# Patient Record
Sex: Female | Born: 1970 | Race: White | Hispanic: No | Smoking: Never smoker
Health system: Southern US, Community
[De-identification: ages and names within clinical notes are randomized; demographics above are authoritative.]

## PROBLEM LIST (undated history)

## (undated) DIAGNOSIS — E079 Disorder of thyroid, unspecified: Secondary | ICD-10-CM

## (undated) DIAGNOSIS — R519 Headache, unspecified: Secondary | ICD-10-CM

## (undated) DIAGNOSIS — G4730 Sleep apnea, unspecified: Secondary | ICD-10-CM

## (undated) DIAGNOSIS — C801 Malignant (primary) neoplasm, unspecified: Secondary | ICD-10-CM

## (undated) DIAGNOSIS — R51 Headache: Secondary | ICD-10-CM

## (undated) HISTORY — PX: ABDOMINAL HYSTERECTOMY: SHX81

## (undated) HISTORY — PX: TOTAL ABDOMINAL HYSTERECTOMY: SHX209

## (undated) HISTORY — PX: BREAST SURGERY: SHX581

---

## 1994-06-21 HISTORY — PX: BREAST REDUCTION SURGERY: SHX8

## 2007-05-13 ENCOUNTER — Emergency Department: Payer: Self-pay | Admitting: Unknown Physician Specialty

## 2010-02-16 ENCOUNTER — Emergency Department: Payer: Self-pay | Admitting: Emergency Medicine

## 2010-03-06 ENCOUNTER — Ambulatory Visit: Payer: Self-pay | Admitting: Internal Medicine

## 2010-03-16 ENCOUNTER — Ambulatory Visit: Payer: Self-pay | Admitting: Internal Medicine

## 2011-03-10 ENCOUNTER — Ambulatory Visit: Payer: Self-pay | Admitting: Internal Medicine

## 2011-04-07 ENCOUNTER — Ambulatory Visit: Payer: Self-pay | Admitting: Internal Medicine

## 2014-06-21 HISTORY — PX: ROBOTIC ASSISTED TOTAL HYSTERECTOMY: SHX6085

## 2014-12-27 DIAGNOSIS — Z8542 Personal history of malignant neoplasm of other parts of uterus: Secondary | ICD-10-CM | POA: Insufficient documentation

## 2014-12-27 DIAGNOSIS — C541 Malignant neoplasm of endometrium: Secondary | ICD-10-CM | POA: Insufficient documentation

## 2015-06-29 ENCOUNTER — Encounter: Payer: Self-pay | Admitting: Internal Medicine

## 2015-06-29 ENCOUNTER — Other Ambulatory Visit: Payer: Self-pay | Admitting: Internal Medicine

## 2015-06-29 DIAGNOSIS — F32A Depression, unspecified: Secondary | ICD-10-CM

## 2015-06-29 DIAGNOSIS — G43909 Migraine, unspecified, not intractable, without status migrainosus: Secondary | ICD-10-CM | POA: Insufficient documentation

## 2015-06-29 DIAGNOSIS — F329 Major depressive disorder, single episode, unspecified: Secondary | ICD-10-CM

## 2015-06-29 DIAGNOSIS — G5600 Carpal tunnel syndrome, unspecified upper limb: Secondary | ICD-10-CM | POA: Insufficient documentation

## 2015-06-29 DIAGNOSIS — E039 Hypothyroidism, unspecified: Secondary | ICD-10-CM

## 2015-06-29 DIAGNOSIS — G4733 Obstructive sleep apnea (adult) (pediatric): Secondary | ICD-10-CM | POA: Insufficient documentation

## 2015-06-29 DIAGNOSIS — E890 Postprocedural hypothyroidism: Secondary | ICD-10-CM | POA: Insufficient documentation

## 2015-06-29 DIAGNOSIS — C541 Malignant neoplasm of endometrium: Secondary | ICD-10-CM

## 2015-06-29 DIAGNOSIS — F324 Major depressive disorder, single episode, in partial remission: Secondary | ICD-10-CM | POA: Insufficient documentation

## 2015-06-29 DIAGNOSIS — I7300 Raynaud's syndrome without gangrene: Secondary | ICD-10-CM | POA: Insufficient documentation

## 2015-06-29 DIAGNOSIS — K219 Gastro-esophageal reflux disease without esophagitis: Secondary | ICD-10-CM

## 2015-06-29 DIAGNOSIS — E785 Hyperlipidemia, unspecified: Secondary | ICD-10-CM | POA: Insufficient documentation

## 2015-07-07 ENCOUNTER — Encounter: Payer: Self-pay | Admitting: Internal Medicine

## 2015-07-07 ENCOUNTER — Ambulatory Visit (INDEPENDENT_AMBULATORY_CARE_PROVIDER_SITE_OTHER): Payer: BC Managed Care – PPO | Admitting: Internal Medicine

## 2015-07-07 ENCOUNTER — Other Ambulatory Visit: Payer: Self-pay | Admitting: Internal Medicine

## 2015-07-07 VITALS — BP 116/70 | HR 76 | Ht 62.0 in | Wt 242.6 lb

## 2015-07-07 DIAGNOSIS — R1011 Right upper quadrant pain: Secondary | ICD-10-CM | POA: Diagnosis not present

## 2015-07-07 DIAGNOSIS — C541 Malignant neoplasm of endometrium: Secondary | ICD-10-CM | POA: Diagnosis not present

## 2015-07-07 DIAGNOSIS — R7303 Prediabetes: Secondary | ICD-10-CM | POA: Diagnosis not present

## 2015-07-07 NOTE — Progress Notes (Signed)
Date:  07/07/2015   Name:  Marilyn Robertson   DOB:  [birth year]   MRN:  [id number]   Chief Complaint: Abdominal Pain and Nausea Abdominal Pain This is a new problem. The current episode started more than 1 month ago. The onset quality is undetermined. The problem occurs every several days. The problem has been gradually worsening. The pain is located in the RUQ. Associated symptoms include constipation, diarrhea, nausea and vomiting. Pertinent negatives include no dysuria, fever or headaches.   for the past 6 months she's had intermittent episodes of nausea with occasional vomiting about 1-2 times every few weeks. This is also associated with spells of loose watery stools that lasts throughout the day. She denies any blood in the stool fever, chills, or weight loss. She is concerned about gallbladder disease. Of note, 6 months ago was told she was prediabetic and started on metformin 1000 mg twice a day.   Review of Systems  Constitutional: Negative for fever, chills, fatigue and unexpected weight change.  Respiratory: Negative for chest tightness and shortness of breath.   Cardiovascular: Negative for chest pain.  Gastrointestinal: Positive for nausea, vomiting, abdominal pain, diarrhea and constipation. Negative for blood in stool.  Genitourinary: Negative for dysuria.  Neurological: Negative for light-headedness and headaches.    Patient Active Problem List   Diagnosis Date Noted  . Clinical depression 06/29/2015  . Headache, migraine 06/29/2015  . Acid reflux 06/29/2015  . Carpal tunnel syndrome 06/29/2015  . Hypothyroidism 06/29/2015  . Paroxysmal digital cyanosis 06/29/2015  . OSA (obstructive sleep apnea) 06/29/2015  . Hyperlipidemia, mild 06/29/2015  . Malignant neoplasm of endometrium ([place]) 12/27/2014    Prior to Admission medications   Medication Sig Start Date End Date Taking? Authorizing Provider  Biotin 2500 MCG CAPS Take by mouth.   Yes Historical Provider, MD    butalbital-acetaminophen-caffeine (FIORICET) 50-325-40 MG tablet Take by mouth.   Yes Historical Provider, MD  Calcium Carbonate-Vit D-Min (CALCIUM 1200 PO) Take by mouth.   Yes Historical Provider, MD  Cetirizine HCl (ZYRTEC ALLERGY) 10 MG CAPS Take by mouth.   Yes Historical Provider, MD  Cholecalciferol (VITAMIN D) 2000 units CAPS Take by mouth.   Yes Historical Provider, MD  co-enzyme Q-10 30 MG capsule Take 30 mg by mouth 3 (three) times daily.   Yes Historical Provider, MD  cyanocobalamin 100 MCG tablet Take 100 mcg by mouth daily.   Yes Historical Provider, MD  diclofenac (CATAFLAM) 50 MG tablet Take by mouth.   Yes Historical Provider, MD  DULoxetine (CYMBALTA) 30 MG capsule Reported on 07/07/2015 11/12/14  Yes Historical Provider, MD  Inulin (FIBER CHOICE PO) Take by mouth.   Yes Historical Provider, MD  metformin (FORTAMET) 1000 MG (OSM) 24 hr tablet Take 1,000 mg by mouth. 11/19/14  Yes Historical Provider, MD  propranolol ER (INDERAL LA) 60 MG 24 hr capsule Take 1 capsule by mouth daily. 05/19/15  Yes Historical Provider, MD  SUMAtriptan (IMITREX) 100 MG tablet Take by mouth.   Yes Historical Provider, MD  thyroid (NATURE-THROID) 130 MG tablet  10/24/14  Yes Historical Provider, MD  traMADol (ULTRAM) 50 MG tablet Take by mouth.   Yes Historical Provider, MD    Allergies  Allergen Reactions  . Bee Venom Shortness Of Breath and Swelling  . Topiramate Swelling    Past Surgical History  Procedure Laterality Date  . Breast reduction surgery  1996  . Robotic assisted total hysterectomy  2016    atypical endometrial hyperplasia  Social History  Substance Use Topics  . Smoking status: Never Smoker   . Smokeless tobacco: None  . Alcohol Use: 1.2 oz/week    2 Standard drinks or equivalent per week     Comment: occasional     Medication list has been reviewed and updated.   Physical Exam  Constitutional: She is oriented to person, place, and time. She appears  well-developed. No distress.  HENT:  Head: Normocephalic and atraumatic.  Cardiovascular: Normal rate, regular rhythm and normal heart sounds.   Pulmonary/Chest: Effort normal and breath sounds normal. No respiratory distress.  Abdominal: Soft. Normal appearance and bowel sounds are normal. There is no hepatosplenomegaly. There is tenderness in the right upper quadrant. There is no rigidity, no rebound, no guarding and no CVA tenderness.  Musculoskeletal: Normal range of motion.  Neurological: She is alert and oriented to person, place, and time.  Skin: Skin is warm and dry. No rash noted.  Psychiatric: She has a normal mood and affect. Her behavior is normal. Thought content normal.  Nursing note and vitals reviewed.   BP 116/70 mmHg  Pulse 76  Ht 5\' 2"  (1.575 m)  Wt 242 lb 9.6 oz (110.043 kg)  BMI 44.36 kg/m2  Assessment and Plan: 1. Right upper quadrant pain Avoid fatty foods Consider trial of proton pump inhibitor if ultrasound is negative - US Abdomen Limited RUQ; Future  2. Pre-diabetes Metformin may be contributing to a number of her symptoms so we will hold that until ultrasound is complete   Halina Maidens, MD [facility]  07/07/2015

## 2015-07-09 ENCOUNTER — Ambulatory Visit
Admission: RE | Admit: 2015-07-09 | Discharge: 2015-07-09 | Disposition: A | Discharge status: Home / Self Care | Payer: BC Managed Care – PPO | Source: Ambulatory Visit | Attending: Internal Medicine | Admitting: Internal Medicine

## 2015-07-09 DIAGNOSIS — K760 Fatty (change of) liver, not elsewhere classified: Secondary | ICD-10-CM | POA: Diagnosis not present

## 2015-07-09 DIAGNOSIS — R1011 Right upper quadrant pain: Secondary | ICD-10-CM | POA: Diagnosis not present

## 2015-07-10 ENCOUNTER — Telehealth: Payer: Self-pay

## 2015-07-10 NOTE — Telephone Encounter (Signed)
Spoke with patient. Patient advised of all results and verbalized understanding. Will call back with any future questions or concerns. MAH  

## 2015-07-10 NOTE — Telephone Encounter (Signed)
-----   Message from Glean Hess, MD sent at 07/09/2015 12:50 PM EST ----- [place] negative for gall stones or any abnormality.  I recommend taking omeprazole 20 mg daily (otc) for 2 weeks.  If still having problems, return for additional tests.

## 2016-10-22 ENCOUNTER — Ambulatory Visit (INDEPENDENT_AMBULATORY_CARE_PROVIDER_SITE_OTHER): Payer: BC Managed Care – PPO | Admitting: Internal Medicine

## 2016-10-22 ENCOUNTER — Encounter: Payer: Self-pay | Admitting: Internal Medicine

## 2016-10-22 VITALS — BP 112/72 | HR 89 | Ht 62.0 in | Wt 258.0 lb

## 2016-10-22 DIAGNOSIS — R42 Dizziness and giddiness: Secondary | ICD-10-CM

## 2016-10-22 DIAGNOSIS — H6501 Acute serous otitis media, right ear: Secondary | ICD-10-CM | POA: Diagnosis not present

## 2016-10-22 MED ORDER — PREDNISONE 10 MG PO TABS: ORAL_TABLET | ORAL | 0 refills | Status: DC

## 2016-10-22 MED ORDER — MECLIZINE HCL 25 MG PO TABS: 25.0000 mg | ORAL_TABLET | Freq: Three times a day (TID) | ORAL | 0 refills | Status: DC | PRN

## 2016-10-22 NOTE — Progress Notes (Signed)
Date:  10/22/2016   Name:  Marilyn Robertson   DOB:  Jul 12, 1970   MRN:  [id number]   Chief Complaint: Dizziness (Worse when lying down. Started thursday and been dizzy since. Has dizzinees feeling while turning head. ) Dizziness  This is a new problem. The current episode started yesterday. The problem has been unchanged. Associated symptoms include nausea. Pertinent negatives include no chest pain, chills, fatigue, fever, sore throat or vomiting. The symptoms are aggravated by twisting and bending. She has tried nothing for the symptoms. The treatment provided no relief.      Review of Systems  Constitutional: Negative for chills, fatigue and fever.  HENT: Negative for hearing loss and sore throat. Ear pain: fullness.   Eyes: Negative for visual disturbance.  Respiratory: Negative for chest tightness and wheezing.   Cardiovascular: Negative for chest pain and palpitations.  Gastrointestinal: Positive for nausea. Negative for vomiting.  Neurological: Positive for dizziness.    Patient Active Problem List   Diagnosis Date Noted  . Pre-diabetes 07/07/2015  . Clinical depression 06/29/2015  . Headache, migraine 06/29/2015  . Acid reflux 06/29/2015  . Carpal tunnel syndrome 06/29/2015  . Hypothyroidism 06/29/2015  . Paroxysmal digital cyanosis 06/29/2015  . OSA (obstructive sleep apnea) 06/29/2015  . Hyperlipidemia, mild 06/29/2015  . Malignant neoplasm of endometrium ([place]) 12/27/2014    Prior to Admission medications   Medication Sig Start Date End Date Taking? Authorizing Provider  Biotin 2500 MCG CAPS Take by mouth.    Historical Provider, MD  butalbital-acetaminophen-caffeine (FIORICET) 50-325-40 MG tablet Take by mouth.    Historical Provider, MD  Calcium Carbonate-Vit D-Min (CALCIUM 1200 PO) Take by mouth.    Historical Provider, MD  Cetirizine HCl (ZYRTEC ALLERGY) 10 MG CAPS Take by mouth.    Historical Provider, MD  Cholecalciferol (VITAMIN D) 2000 units CAPS Take by  mouth.    Historical Provider, MD  co-enzyme Q-10 30 MG capsule Take 30 mg by mouth 3 (three) times daily.    Historical Provider, MD  cyanocobalamin 100 MCG tablet Take 100 mcg by mouth daily.    Historical Provider, MD  diclofenac (CATAFLAM) 50 MG tablet Take by mouth.    Historical Provider, MD  DULoxetine (CYMBALTA) 30 MG capsule Reported on 07/07/2015 11/12/14   Historical Provider, MD  Inulin (FIBER CHOICE PO) Take by mouth.    Historical Provider, MD  metformin (FORTAMET) 1000 MG (OSM) 24 hr tablet Take 1,000 mg by mouth. 11/19/14   Historical Provider, MD  propranolol ER (INDERAL LA) 60 MG 24 hr capsule Take 1 capsule by mouth daily. 05/19/15   Historical Provider, MD  SUMAtriptan (IMITREX) 100 MG tablet Take by mouth.    Historical Provider, MD  thyroid (NATURE-THROID) 130 MG tablet  10/24/14   Historical Provider, MD  traMADol (ULTRAM) 50 MG tablet Take by mouth.    Historical Provider, MD    Allergies  Allergen Reactions  . Bee Venom Shortness Of Breath and Swelling  . Topiramate Swelling, Anxiety and Rash    "severe watery eyes"; "swelling and reddening of face"     Past Surgical History:  Procedure Laterality Date  . BREAST REDUCTION SURGERY  1996  . ROBOTIC ASSISTED TOTAL HYSTERECTOMY  2016   atypical endometrial hyperplasia    Social History  Substance Use Topics  . Smoking status: Never Smoker  . Smokeless tobacco: Never Used  . Alcohol use 1.2 oz/week    2 Standard drinks or equivalent per week  Comment: occasional     Medication list has been reviewed and updated.   Physical Exam  Constitutional: She is oriented to person, place, and time. She appears well-developed. No distress.  HENT:  Head: Normocephalic and atraumatic.  Right Ear: Ear canal normal. A middle ear effusion is present.  Left Ear: Tympanic membrane and ear canal normal.  Mouth/Throat: No posterior oropharyngeal edema or posterior oropharyngeal erythema.  Eyes: Conjunctivae are normal.  Pupils are equal, round, and reactive to light. Right eye exhibits nystagmus. Left eye exhibits nystagmus.  Cardiovascular: Normal rate, regular rhythm and normal heart sounds.   Pulmonary/Chest: Effort normal and breath sounds normal. No respiratory distress.  Musculoskeletal: Normal range of motion.  Neurological: She is alert and oriented to person, place, and time.  Skin: Skin is warm and dry. No rash noted.  Psychiatric: She has a normal mood and affect. Her behavior is normal. Thought content normal.  Nursing note and vitals reviewed.   BP 112/72 (BP Location: Right Arm, Patient Position: Sitting, Cuff Size: Large)   Pulse 89   Ht 5\' 2"  (1.575 m)   Wt 258 lb (117 kg)   SpO2 96%   BMI 47.19 kg/m   Assessment and Plan: 1. Vertigo Continue flonase  - predniSONE (DELTASONE) 10 MG tablet; Take 6 on day 1, 5 on day 2, 4 on day 3, 3 on day 4, 2 on day 5 and 1 on day 1 then stop.  Dispense: 21 tablet; Refill: 0 - meclizine (ANTIVERT) 25 MG tablet; Take 1 tablet (25 mg total) by mouth 3 (three) times daily as needed for dizziness.  Dispense: 30 tablet; Refill: 0  2. Right acute serous otitis media, recurrence not specified Continue claritin   Meds ordered this encounter  Medications  . predniSONE (DELTASONE) 10 MG tablet    Sig: Take 6 on day 1, 5 on day 2, 4 on day 3, 3 on day 4, 2 on day 5 and 1 on day 1 then stop.    Dispense:  21 tablet    Refill:  0  . meclizine (ANTIVERT) 25 MG tablet    Sig: Take 1 tablet (25 mg total) by mouth 3 (three) times daily as needed for dizziness.    Dispense:  30 tablet    Refill:  0    Halina Maidens, MD [facility]  10/22/2016

## 2017-05-31 ENCOUNTER — Encounter: Payer: Self-pay | Admitting: Emergency Medicine

## 2017-05-31 ENCOUNTER — Emergency Department: Payer: BC Managed Care – PPO

## 2017-05-31 ENCOUNTER — Emergency Department
Admission: EM | Admit: 2017-05-31 | Discharge: 2017-05-31 | Disposition: A | Discharge status: Home / Self Care | Payer: BC Managed Care – PPO | Attending: Student in an Organized Health Care Education/Training Program | Admitting: Student in an Organized Health Care Education/Training Program

## 2017-05-31 DIAGNOSIS — W000XXA Fall on same level due to ice and snow, initial encounter: Secondary | ICD-10-CM | POA: Diagnosis not present

## 2017-05-31 DIAGNOSIS — Y999 Unspecified external cause status: Secondary | ICD-10-CM | POA: Insufficient documentation

## 2017-05-31 DIAGNOSIS — Y9389 Activity, other specified: Secondary | ICD-10-CM | POA: Insufficient documentation

## 2017-05-31 DIAGNOSIS — Y929 Unspecified place or not applicable: Secondary | ICD-10-CM | POA: Insufficient documentation

## 2017-05-31 DIAGNOSIS — E039 Hypothyroidism, unspecified: Secondary | ICD-10-CM | POA: Diagnosis not present

## 2017-05-31 DIAGNOSIS — S065X9A Traumatic subdural hemorrhage with loss of consciousness of unspecified duration, initial encounter: Secondary | ICD-10-CM | POA: Diagnosis not present

## 2017-05-31 DIAGNOSIS — Z79899 Other long term (current) drug therapy: Secondary | ICD-10-CM | POA: Diagnosis not present

## 2017-05-31 DIAGNOSIS — Z8542 Personal history of malignant neoplasm of other parts of uterus: Secondary | ICD-10-CM | POA: Insufficient documentation

## 2017-05-31 DIAGNOSIS — S060X9A Concussion with loss of consciousness of unspecified duration, initial encounter: Secondary | ICD-10-CM | POA: Insufficient documentation

## 2017-05-31 DIAGNOSIS — S0990XA Unspecified injury of head, initial encounter: Secondary | ICD-10-CM | POA: Diagnosis present

## 2017-05-31 DIAGNOSIS — X58XXXA Exposure to other specified factors, initial encounter: Secondary | ICD-10-CM | POA: Insufficient documentation

## 2017-05-31 DIAGNOSIS — W19XXXA Unspecified fall, initial encounter: Secondary | ICD-10-CM

## 2017-05-31 DIAGNOSIS — S066X0A Traumatic subarachnoid hemorrhage without loss of consciousness, initial encounter: Secondary | ICD-10-CM

## 2017-05-31 DIAGNOSIS — S065XAA Traumatic subdural hemorrhage with loss of consciousness status unknown, initial encounter: Secondary | ICD-10-CM

## 2017-05-31 MED ORDER — ACETAMINOPHEN 500 MG PO TABS
1000.0000 mg | ORAL_TABLET | Freq: Once | ORAL | Status: AC
  Administered 2017-05-31: 1000 mg via ORAL
  Filled 2017-05-31: qty 2

## 2017-05-31 NOTE — ED Provider Notes (Signed)
Patient received in sign-out from Dr. Alfred Levins.  Workup and evaluation pending repeat CT imaging.  Patient has been evaluated by neurosurgery at bedside.  Plan was for repeat head CT at 5-hour mark.  As she is low risk with no history of bleeding disorders and not on any anticoagulants patient will be stable for outpatient follow-up with stable CT head.  Repeat CT head shows no acute images are worsening of traumatic findings as described on previous CT.  Patient did note distress.  Patient stable for follow-up.  Have discussed with the patient and available family all diagnostics and treatments performed thus far and all questions were answered to the best of my ability. The patient demonstrates understanding and agreement with plan. Merlyn Lot, MD 05/31/17 [phone]

## 2017-05-31 NOTE — ED Notes (Signed)
Pt is currently refusing to have IV placed. Pt reports if IV is needed if she has neuro changes she will accept one at that time.

## 2017-05-31 NOTE — ED Triage Notes (Signed)
Patient presents to the ED post fall.  Patient states she was scraping ice from her car and she slipped and fell and hit her head.  Patient is unsure of whether she lost consciousness.  Patient fell approx. 1 hour prior to arrival.  Patient states she vaguely remembers her neighbor helping her up but she has a memory gap, not remembering walking into her apartment.  Patient is alert and oriented x 4.  Patient has been repeating herself.  Patient denies vomiting.

## 2017-05-31 NOTE — ED Notes (Signed)
Pt taken for repeat CT scan. Family remains in treatment room.

## 2017-05-31 NOTE — ED Notes (Signed)
Pt denies having neuro changes and no changes noted. Pt reports pain in head feels as though it has moved to behind her eyes but opt denies changes in vision. Pupils remain equal and reactive.

## 2017-05-31 NOTE — Consult Note (Addendum)
Neurosurgery-New Consultation Evaluation 05/31/2017 Marilyn Robertson [id number]  Identifying Statement: Marilyn Robertson is a 46 y.o. female from [place] [id number] with intracranial hemorhage  Physician Requesting Consultation: Dr. Quentin Cornwall, [facility]  History of Present Illness: Marilyn Robertson presents to the ED following a fall at home on the ice. She does not remember the events but notes pain to back of head. She was able to go back into house where family says she was confused. At the ED, she is neurologically intact. She denies current headache, She does not note a headache prior to fall. She denies any dizziness.   She does not have any vision changes, weakness, numbness, or speech changes.   Past Medical History:  History reviewed. No pertinent past medical history.  Social History: Social History   Socioeconomic History  . Marital status: Married    Spouse name: Not on file  . Number of children: Not on file  . Years of education: Not on file  . Highest education level: Not on file  Social Needs  . Financial resource strain: Not on file  . Food insecurity - worry: Not on file  . Food insecurity - inability: Not on file  . Transportation needs - medical: Not on file  . Transportation needs - non-medical: Not on file  Occupational History  . Not on file  Tobacco Use  . Smoking status: Never Smoker  . Smokeless tobacco: Never Used  Substance and Sexual Activity  . Alcohol use: Yes    Alcohol/week: 1.2 oz    Types: 2 Standard drinks or equivalent per week    Comment: occasional  . Drug use: No  . Sexual activity: Not on file  Other Topics Concern  . Not on file  Social History Narrative  . Not on file   Living arrangements (living alone, with partner): Lives with husband  Family History: Family History  Problem Relation Age of Onset  . Depression Mother   . Prostate cancer Father   . Cervical cancer Sister    No history of cranial hemorrhage in family.    Review of Systems:  Review of Systems - General ROS: Negative Psychological ROS: Negative Ophthalmic ROS: Negative for vision changes ENT ROS: Negative Hematological and Lymphatic ROS: Negative  Endocrine ROS: Negative Respiratory ROS: Negative Cardiovascular ROS: Negative Gastrointestinal ROS: Negative Genito-Urinary ROS: Negative Musculoskeletal ROS: Negative Neurological ROS: Negative for headache Dermatological ROS: Negative  Physical Exam: BP 130/85   Pulse 88   Temp 97.7 F (36.5 C) (Oral)   Resp 16   Ht 5\' 2"  (1.575 m)   Wt 111.1 kg (245 lb)   SpO2 100%   BMI 44.81 kg/m  Body mass index is 44.81 kg/m. Body surface area is 2.2 meters squared. General appearance: Alert, cooperative, in no acute distress Head: Normocephalic, small area of swelling posteriorly Eyes: Normal, EOM intact Oropharynx: Moist without lesions Heart: Normal, regular rate and rhythm Lungs: Normal effort, no wheezing Ext: No edema in LE bilaterally, good distal pulses  Neurologic exam:  Mental status: alertness: alert, orientation: person, place, time, affect: normal Speech: fluent and clear, naming and repetition intact Cranial nerves:  II: Visual fields are full by confrontation, no ptosis III/IV/VI: extra-ocular motions intact bilaterally V/VII:no evidence of facial droop or weakness VIII: hearing normal XI: trapezius strength symmetric XII: tongue strength symmetric  Motor:strength symmetric 5/5, normal muscle mass and tone in all extremities and no pronator drift Sensory: intact to light touch in all extremities Coordination: intact finger  to nose Gait: Not tested  Imaging: CT Head: Small anterior subdural hematoma with maximum thickness in the area of hematoma of 3 mm. A small amount of subarachnoid hemorrhage is noted in each frontal lobe just to the left and to the right of the anterior falx. Subarachnoid hemorrhage is also noted marginally in the superior suprasellar  cistern. There is no appreciable mass effect or edema. No midline shift. No acute infarct.   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 She is on no antiplatelet or anticoagulant and her blood pressure is well controlled. Her neurological exam is normal. Given this, there is no surgical intervention indicated. I have recommended a repeat CT scan to ensure stability We have discussed that she may have some concussive symptoms over the next week and to perform light activity if able to reduce symptoms.   1.  Diagnosis: Mild traumatic SAH and SDH  2.  Plan - Repeat CT scan in 5 hours.    Repeat CT scan was reviewed which was stable. She was cleared for discharge and follow up recommended with PCP

## 2017-05-31 NOTE — ED Provider Notes (Signed)
[facility] Emergency Department Provider Note  ____________________________________________  Time seen: Approximately 2:56 PM  I have reviewed the triage vital signs and the nursing notes.   HISTORY  Chief Complaint Fall   HPI Marilyn Robertson is a 46 y.o. female who presents for evaluation of head trauma. According to patient's daughter, patient went mouth to remove the eyes from the car today could go shopping. The daughter was inside the house getting dressed and the patient walked back inside and told her daughter that she thought she had a concussion. Patient showed her a large hematoma in the back of her head and according to a neighbor,patient was on the ground next to her car. Patient has no recollection of ever going out the apartment or back in, or falling. Patient is complaining of 8 out of 10 generalized headache that has been constant since prior to arrival and nonradiating. She denies changes in vision. She has had nausea but no vomiting. Per patient's daughter, patient was acting very strange and did not remember the snowstorm this weekend and did not know the day of the week which prompted them to bring her to the emergency room for evaluation. Patient is not on blood thinners. She denies neck pain, back pain, chest pain, abdominal pain, extremity pain.  History reviewed. No pertinent past medical history.  Patient Active Problem List   Diagnosis Date Noted  . Pre-diabetes 07/07/2015  . Clinical depression 06/29/2015  . Headache, migraine 06/29/2015  . Acid reflux 06/29/2015  . Carpal tunnel syndrome 06/29/2015  . Hypothyroidism 06/29/2015  . Paroxysmal digital cyanosis 06/29/2015  . OSA (obstructive sleep apnea) 06/29/2015  . Hyperlipidemia, mild 06/29/2015  . Malignant neoplasm of endometrium ([place]) 12/27/2014    Past Surgical History:  Procedure Laterality Date  . BREAST REDUCTION SURGERY  1996  . ROBOTIC ASSISTED TOTAL HYSTERECTOMY   2016   atypical endometrial hyperplasia    Prior to Admission medications   Medication Sig Start Date End Date Taking? Authorizing Provider  Cetirizine HCl (ZYRTEC ALLERGY) 10 MG CAPS Take 1 capsule by mouth daily.    Yes [provider]  clonazePAM (KLONOPIN) 0.5 MG tablet Take 0.5-1 mg by mouth 3 (three) times daily as needed for anxiety.   Yes [provider]  DULoxetine (CYMBALTA) 30 MG capsule Take 90 mg by mouth daily. Reported on 07/07/2015 11/12/14  Yes [provider]  propranolol ER (INDERAL LA) 60 MG 24 hr capsule Take 1 capsule by mouth daily. 05/19/15  Yes [provider]  thyroid (NATURE-THROID) 130 MG tablet Take 130 mg by mouth daily.  10/24/14  Yes [provider]  butalbital-acetaminophen-caffeine (FIORICET) 50-325-40 MG tablet Take 1 tablet by mouth every 6 (six) hours as needed.     [provider]  diclofenac (CATAFLAM) 50 MG tablet Take 50 mg by mouth daily as needed.     [provider]  meclizine (ANTIVERT) 25 MG tablet Take 1 tablet (25 mg total) by mouth 3 (three) times daily as needed for dizziness. Patient not taking: Reported on 05/31/2017 10/22/16   Glean Hess, MD  predniSONE (DELTASONE) 10 MG tablet Take 6 on day 1, 5 on day 2, 4 on day 3, 3 on day 4, 2 on day 5 and 1 on day 1 then stop. Patient not taking: Reported on 05/31/2017 10/22/16   Glean Hess, MD  SUMAtriptan (IMITREX) 100 MG tablet Take 100 mg by mouth daily as needed.     [provider]  traMADol (ULTRAM) 50 MG tablet Take 50 mg by mouth daily as needed.     [provider]    Allergies Bee venom and Topiramate  Family History  Problem Relation Age of Onset  . Depression Mother   . Prostate cancer Father   . Cervical cancer Sister     Social History Social History   Tobacco Use  . Smoking status: Never Smoker  . Smokeless tobacco: Never Used  Substance Use Topics  . Alcohol use: Yes    Alcohol/week:  1.2 oz    Types: 2 Standard drinks or equivalent per week    Comment: occasional  . Drug use: No    Review of Systems Constitutional: Negative for fever. Eyes: Negative for visual changes. ENT: Negative for facial injury or neck injury Cardiovascular: Negative for chest injury. Respiratory: Negative for shortness of breath. Negative for chest wall injury. Gastrointestinal: Negative for abdominal pain or injury. Genitourinary: Negative for dysuria. Musculoskeletal: Negative for back injury, negative for arm or leg pain. Skin: Negative for laceration/abrasions. Neurological: + head injury.  ____________________________________________   PHYSICAL EXAM:  VITAL SIGNS: ED Triage Vitals  Enc Vitals Group     BP 05/31/17 1219 (!) 146/98     Pulse Rate 05/31/17 1219 76     Resp 05/31/17 1219 18     Temp 05/31/17 1219 97.7 F (36.5 C)     Temp Source 05/31/17 1219 Oral     SpO2 05/31/17 1219 100 %     Weight 05/31/17 1220 245 lb (111.1 kg)     Height 05/31/17 1220 5\' 2"  (1.575 m)     Head Circumference --      Peak Flow --      Pain Score 05/31/17 1220 7     Pain Loc --      Pain Edu? --      Excl. in [place]? --     Constitutional: Alert and oriented x 3. No acute distress. Does not appear intoxicated. HEENT Head: Normocephalic and parietal hematoma. Face: No facial bony tenderness. Stable midface Ears: No hemotympanum bilaterally. No Battle sign Eyes: No eye injury. PERRL. No raccoon eyes Nose: Nontender. No epistaxis. No rhinorrhea Mouth/Throat: Mucous membranes are moist. No oropharyngeal blood. No dental injury. Airway patent without stridor. Normal voice. Neck: no C-collar in place. No midline c-spine tenderness.  Cardiovascular: Normal rate, regular rhythm. Normal and symmetric distal pulses are present in all extremities. Pulmonary/Chest: Chest wall is stable and nontender to palpation/compression. Normal respiratory effort. Breath sounds are normal. No crepitus.    Abdominal: Soft, nontender, non distended. Musculoskeletal: Nontender with normal full range of motion in all extremities. No deformities. No thoracic or lumbar midline spinal tenderness. Pelvis is stable. Skin: Skin is warm, dry and intact. No abrasions or contutions. Psychiatric: Speech and behavior are appropriate. Neurological: Normal speech and language. Moves all extremities to command. No gross focal neurologic deficits are appreciated.  Glascow Coma Score: 4 - Opens eyes on own 6 - Follows simple motor commands 5 - Alert and oriented GCS: 15  ____________________________________________   LABS (all labs ordered are listed, but only abnormal results are displayed)  Labs Reviewed - No data to display ____________________________________________  EKG  none  ____________________________________________  RADIOLOGY  CT head:  Small anterior subdural hematoma with maximum thickness in the area of hematoma of 3 mm. A small amount of subarachnoid hemorrhage is noted in each frontal lobe just to the left and to the right of  the anterior falx. Subarachnoid hemorrhage is also noted marginally in the superior suprasellar cistern. There is no appreciable mass effect or edema. No midline shift. No acute infarct.  Right superior parietal scalp hematoma without fracture.   CT cspine: Negative for cervical spine fracture ____________________________________________   PROCEDURES  Procedure(s) performed: None Procedures Critical Care performed:  None ____________________________________________   INITIAL IMPRESSION / ASSESSMENT AND PLAN / ED COURSE  46 y.o. female who presents for evaluation of head trauma and concussive symptoms. Patient is neuro intact, GCS 15, A&O x 3 at this time. Patient has a parietal hematoma with no signs or symptoms of basilar skull fracture. Head CT concerning for small anterior subdural hematoma and a small amount of subarachnoid hemorrhage in the  frontal lobe. I discussed these findings with Dr. Lacinda Axon, neurosurgeon on call who will evaluate patient in the emergency room. He recommended monitoring patient for 5 hours and repeating CT. If CT remains unchanged and patient's neurological exam remains intact plan to discharge home. Care transferred to Dr. Quentin Cornwall.      As part of my medical decision making, I reviewed the following data within the [place] History obtained from family, Nursing notes reviewed and incorporated, Patient signed out to Dr. Quentin Cornwall, Radiograph reviewed , A consult was requested and obtained from this/these consultant(s) NSG, Notes from prior ED visits and [place] Controlled Substance Database    Pertinent labs & imaging results that were available during my care of the patient were reviewed by me and considered in my medical decision making (see chart for details).    ____________________________________________   FINAL CLINICAL IMPRESSION(S) / ED DIAGNOSES  Final diagnoses:  Fall, initial encounter  Subarachnoid hemorrhage following injury, no loss of consciousness, initial encounter ([place])  SDH (subdural hematoma) ([place])      NEW MEDICATIONS STARTED DURING THIS VISIT:  ED Discharge Orders    None       Note:  This document was prepared using Dragon voice recognition software and may include unintentional dictation errors.    Rudene Re, MD 05/31/17 1556

## 2017-06-08 ENCOUNTER — Emergency Department: Payer: BC Managed Care – PPO

## 2017-06-08 ENCOUNTER — Other Ambulatory Visit: Payer: Self-pay

## 2017-06-08 ENCOUNTER — Emergency Department
Admission: EM | Admit: 2017-06-08 | Discharge: 2017-06-08 | Disposition: A | Discharge status: Home / Self Care | Payer: BC Managed Care – PPO | Attending: Emergency Medicine | Admitting: Emergency Medicine

## 2017-06-08 ENCOUNTER — Encounter: Payer: Self-pay | Admitting: Emergency Medicine

## 2017-06-08 DIAGNOSIS — R519 Headache, unspecified: Secondary | ICD-10-CM

## 2017-06-08 DIAGNOSIS — R51 Headache: Secondary | ICD-10-CM | POA: Insufficient documentation

## 2017-06-08 DIAGNOSIS — R42 Dizziness and giddiness: Secondary | ICD-10-CM | POA: Diagnosis not present

## 2017-06-08 DIAGNOSIS — E039 Hypothyroidism, unspecified: Secondary | ICD-10-CM | POA: Diagnosis not present

## 2017-06-08 LAB — CBC WITH DIFFERENTIAL/PLATELET
Basophils Absolute: 0 10*3/uL (ref 0–0.1)
Basophils Relative: 0 %
EOS ABS: 0.2 10*3/uL (ref 0–0.7)
EOS PCT: 2 %
HCT: 38.4 % (ref 35.0–47.0)
HEMOGLOBIN: 13.1 g/dL (ref 12.0–16.0)
Lymphocytes Relative: 16 %
Lymphs Abs: 1.4 10*3/uL (ref 1.0–3.6)
MCH: 30.5 pg (ref 26.0–34.0)
MCHC: 34 g/dL (ref 32.0–36.0)
MCV: 89.6 fL (ref 80.0–100.0)
MONOS PCT: 6 %
Monocytes Absolute: 0.5 10*3/uL (ref 0.2–0.9)
NEUTROS PCT: 76 %
Neutro Abs: 6.8 10*3/uL — ABNORMAL HIGH (ref 1.4–6.5)
Platelets: 353 10*3/uL (ref 150–440)
RBC: 4.29 MIL/uL (ref 3.80–5.20)
RDW: 14.2 % (ref 11.5–14.5)
WBC: 8.9 10*3/uL (ref 3.6–11.0)

## 2017-06-08 LAB — COMPREHENSIVE METABOLIC PANEL
ALK PHOS: 111 U/L (ref 38–126)
ALT: 31 U/L (ref 14–54)
ANION GAP: 7 (ref 5–15)
AST: 30 U/L (ref 15–41)
Albumin: 4.1 g/dL (ref 3.5–5.0)
BUN: 7 mg/dL (ref 6–20)
CALCIUM: 9.1 mg/dL (ref 8.9–10.3)
CO2: 26 mmol/L (ref 22–32)
Chloride: 109 mmol/L (ref 101–111)
Creatinine, Ser: 0.65 mg/dL (ref 0.44–1.00)
GFR calc non Af Amer: >60 mL/min (ref >60)
Glucose, Bld: 95 mg/dL (ref 65–99)
Potassium: 4.1 mmol/L (ref 3.5–5.1)
SODIUM: 142 mmol/L (ref 135–145)
TOTAL PROTEIN: 7.5 g/dL (ref 6.5–8.1)
Total Bilirubin: 0.4 mg/dL (ref 0.3–1.2)

## 2017-06-08 MED ORDER — MORPHINE SULFATE (PF) 4 MG/ML IV SOLN
4.0000 mg | Freq: Once | INTRAVENOUS | Status: AC
  Administered 2017-06-08: 4 mg via INTRAVENOUS
  Filled 2017-06-08: qty 1

## 2017-06-08 MED ORDER — SODIUM CHLORIDE 0.9 % IV SOLN
Freq: Once | INTRAVENOUS | Status: AC
  Administered 2017-06-08: 11:00:00 via INTRAVENOUS

## 2017-06-08 MED ORDER — HYDROMORPHONE HCL 1 MG/ML IJ SOLN
1.0000 mg | Freq: Once | INTRAMUSCULAR | Status: AC
  Administered 2017-06-08: 1 mg via INTRAVENOUS
  Filled 2017-06-08: qty 1

## 2017-06-08 MED ORDER — METOCLOPRAMIDE HCL 5 MG/ML IJ SOLN
10.0000 mg | Freq: Once | INTRAMUSCULAR | Status: AC
  Administered 2017-06-08: 10 mg via INTRAVENOUS
  Filled 2017-06-08: qty 2

## 2017-06-08 MED ORDER — OXYCODONE-ACETAMINOPHEN 5-325 MG PO TABS: 1.0000 | ORAL_TABLET | Freq: Three times a day (TID) | ORAL | 0 refills | Status: DC | PRN

## 2017-06-08 MED ORDER — SODIUM CHLORIDE 0.9 % IV SOLN: Freq: Once | INTRAVENOUS | Status: DC

## 2017-06-08 NOTE — ED Notes (Signed)
Pt ambulatory to toilet by self.  

## 2017-06-08 NOTE — ED Notes (Signed)
Pt up to the bathroom with assistance from her husband at this time.

## 2017-06-08 NOTE — ED Provider Notes (Signed)
[facility] Emergency Department Provider Note       Time seen: ----------------------------------------- 9:54 AM on 06/08/2017 -----------------------------------------   I have reviewed the triage vital signs and the nursing notes.  HISTORY   Chief Complaint Headache    HPI Marilyn Robertson is a 46 y.o. female with a history of depression, migraine headaches, hypothyroidism who presents to the ED for persistent headache.  Patient arrives by private vehicle stating she had a headache since falling on the 11th of this month.  She reports a headache has waxed and waned but is not completely resolved.  She did have a small subarachnoid hemorrhage and has had some minor dizziness.  She will reports waves of diffuse pressure in her head and intermittently feels like she was going to pass out.  She has had nausea but no vomiting.  She took Fioricet with some improvement.  History reviewed. No pertinent past medical history.  Patient Active Problem List   Diagnosis Date Noted  . Pre-diabetes 07/07/2015  . Clinical depression 06/29/2015  . Headache, migraine 06/29/2015  . Acid reflux 06/29/2015  . Carpal tunnel syndrome 06/29/2015  . Hypothyroidism 06/29/2015  . Paroxysmal digital cyanosis 06/29/2015  . OSA (obstructive sleep apnea) 06/29/2015  . Hyperlipidemia, mild 06/29/2015  . Malignant neoplasm of endometrium ([place]) 12/27/2014    Past Surgical History:  Procedure Laterality Date  . BREAST REDUCTION SURGERY  1996  . ROBOTIC ASSISTED TOTAL HYSTERECTOMY  2016   atypical endometrial hyperplasia    Allergies Bee venom and Topiramate  Social History Social History   Tobacco Use  . Smoking status: Never Smoker  . Smokeless tobacco: Never Used  Substance Use Topics  . Alcohol use: Yes    Alcohol/week: 1.2 oz    Types: 2 Standard drinks or equivalent per week    Comment: occasional  . Drug use: No    Review of Systems Constitutional: Negative  for fever. Cardiovascular: Negative for chest pain. Respiratory: Negative for shortness of breath. Gastrointestinal: Negative for abdominal pain, positive for nausea Genitourinary: Negative for dysuria. Musculoskeletal: Negative for back pain. Skin: Negative for rash. Neurological: Positive for headache  All systems negative/normal/unremarkable except as stated in the HPI  ____________________________________________   PHYSICAL EXAM:  VITAL SIGNS: ED Triage Vitals  Enc Vitals Group     BP 06/08/17 0932 129/61     Pulse Rate 06/08/17 0929 63     Resp 06/08/17 0929 16     Temp 06/08/17 0929 98.1 F (36.7 C)     Temp Source 06/08/17 0929 Oral     SpO2 06/08/17 0929 98 %     Weight --      Height --      Head Circumference --      Peak Flow --      Pain Score 06/08/17 0928 7     Pain Loc --      Pain Edu? --      Excl. in [place]? --    Constitutional: Alert and oriented. Well appearing and in no distress. Eyes: Conjunctivae are normal. Normal extraocular movements.  Pupils equal round and reactive to light ENT   Head: Normocephalic and atraumatic.   Nose: No congestion/rhinnorhea.   Mouth/Throat: Mucous membranes are moist.   Neck: No stridor. Cardiovascular: Normal rate, regular rhythm. No murmurs, rubs, or gallops. Respiratory: Normal respiratory effort without tachypnea nor retractions. Breath sounds are clear and equal bilaterally. No wheezes/rales/rhonchi. Gastrointestinal: Soft and nontender. Normal bowel sounds Musculoskeletal: Nontender with  normal range of motion in extremities. No lower extremity tenderness nor edema. Neurologic:  Normal speech and language. No gross focal neurologic deficits are appreciated.  Skin:  Skin is warm, dry and intact. No rash noted. Psychiatric: Mood and affect are normal. Speech and behavior are normal.  ____________________________________________  ED COURSE:  As part of my medical decision making, I reviewed the  following data within the [place] History obtained from family if available, nursing notes, old chart and ekg, as well as notes from prior ED visits. Patient presented for headache with recent head injury and head bleed, we will assess with labs and imaging as indicated at this time.   Procedures ____________________________________________   LABS (pertinent positives/negatives)  Labs Reviewed  CBC WITH DIFFERENTIAL/PLATELET - Abnormal; Notable for the following components:      Result Value   Neutro Abs 6.8 (*)    All other components within normal limits  COMPREHENSIVE METABOLIC PANEL    RADIOLOGY Images were viewed by me  CT head IMPRESSION: 1. Essentially resolved anterior parafalcine subdural hematoma and adjacent small volume subarachnoid hemorrhage. No new hemorrhage or acute intracranial abnormality. 2. Interval decrease in size of posterior scalp hematoma. ____________________________________________  DIFFERENTIAL DIAGNOSIS   Intracranial hemorrhage, migraine headache, dehydration, electrolyte abnormality, tension headache, cluster headache,  FINAL ASSESSMENT AND PLAN  Headache   Plan: Patient had presented for persistent headache. Patient's labs are reassuring. Patient's imaging revealed resolution in previous subdural and subarachnoid hemorrhage.  This is likely a migraine and/or combined event related to recent concussion.  Overall she is stable for outpatient follow-up.   Earleen Newport, MD   Note: This note was generated in part or whole with voice recognition software. Voice recognition is usually quite accurate but there are transcription errors that can and very often do occur. I apologize for any typographical errors that were not detected and corrected.     Earleen Newport, MD 06/08/17 1310

## 2017-06-08 NOTE — ED Notes (Signed)
Pt taken to CT at this time.

## 2017-06-08 NOTE — ED Triage Notes (Signed)
Pt to ED via POV, pt states that she has had a headache since falling on 12/11. Pt states that the headache has increased and decreased in intensity but has not completely resolved. Pt was diagnosed with small SAH on 12/11. Pt states that pt has had minor dizziness since then. Pt also reports waves of pressure and feeling like she is going to pass out. Pt states that Monday and night and Tuesday she had spells when her legs felt "numb". Pt denies vomiting but she has had nausea. Pt in NAD at this time.

## 2017-06-08 NOTE — ED Notes (Signed)
FIRST NURSE NOTE:  Pt c/o headache, here last week dx with subarchnoid hemorrhage. Pt placed in wheelchair on arrival.

## 2017-08-23 ENCOUNTER — Encounter: Payer: Self-pay | Admitting: Student

## 2017-08-24 ENCOUNTER — Ambulatory Visit: Payer: BC Managed Care – PPO | Admitting: Anesthesiology

## 2017-08-24 ENCOUNTER — Encounter
Admission: RE | Disposition: A | Discharge status: Home / Self Care | Payer: Self-pay | Source: Ambulatory Visit | Attending: Internal Medicine

## 2017-08-24 ENCOUNTER — Encounter: Payer: Self-pay | Admitting: *Deleted

## 2017-08-24 ENCOUNTER — Ambulatory Visit
Admission: RE | Admit: 2017-08-24 | Discharge: 2017-08-24 | Disposition: A | Discharge status: Home / Self Care | Payer: BC Managed Care – PPO | Source: Ambulatory Visit | Attending: Internal Medicine | Admitting: Internal Medicine

## 2017-08-24 DIAGNOSIS — Z888 Allergy status to other drugs, medicaments and biological substances status: Secondary | ICD-10-CM | POA: Insufficient documentation

## 2017-08-24 DIAGNOSIS — E063 Autoimmune thyroiditis: Secondary | ICD-10-CM | POA: Diagnosis not present

## 2017-08-24 DIAGNOSIS — Z79899 Other long term (current) drug therapy: Secondary | ICD-10-CM | POA: Insufficient documentation

## 2017-08-24 DIAGNOSIS — G4730 Sleep apnea, unspecified: Secondary | ICD-10-CM | POA: Insufficient documentation

## 2017-08-24 DIAGNOSIS — K5730 Diverticulosis of large intestine without perforation or abscess without bleeding: Secondary | ICD-10-CM | POA: Insufficient documentation

## 2017-08-24 DIAGNOSIS — K219 Gastro-esophageal reflux disease without esophagitis: Secondary | ICD-10-CM | POA: Insufficient documentation

## 2017-08-24 DIAGNOSIS — I739 Peripheral vascular disease, unspecified: Secondary | ICD-10-CM | POA: Diagnosis not present

## 2017-08-24 DIAGNOSIS — K640 First degree hemorrhoids: Secondary | ICD-10-CM | POA: Diagnosis not present

## 2017-08-24 DIAGNOSIS — Z1211 Encounter for screening for malignant neoplasm of colon: Secondary | ICD-10-CM | POA: Diagnosis present

## 2017-08-24 DIAGNOSIS — Z91030 Bee allergy status: Secondary | ICD-10-CM | POA: Insufficient documentation

## 2017-08-24 DIAGNOSIS — Z8371 Family history of colonic polyps: Secondary | ICD-10-CM | POA: Insufficient documentation

## 2017-08-24 DIAGNOSIS — Z859 Personal history of malignant neoplasm, unspecified: Secondary | ICD-10-CM | POA: Diagnosis not present

## 2017-08-24 DIAGNOSIS — Z90710 Acquired absence of both cervix and uterus: Secondary | ICD-10-CM | POA: Insufficient documentation

## 2017-08-24 HISTORY — DX: Headache, unspecified: R51.9

## 2017-08-24 HISTORY — DX: Headache: R51

## 2017-08-24 HISTORY — DX: Disorder of thyroid, unspecified: E07.9

## 2017-08-24 HISTORY — DX: Malignant (primary) neoplasm, unspecified: C80.1

## 2017-08-24 HISTORY — PX: COLONOSCOPY WITH PROPOFOL: SHX5780

## 2017-08-24 HISTORY — DX: Sleep apnea, unspecified: G47.30

## 2017-08-24 SURGERY — COLONOSCOPY WITH PROPOFOL
Anesthesia: General

## 2017-08-24 MED ORDER — PROPOFOL 10 MG/ML IV BOLUS
INTRAVENOUS | Status: DC | PRN
  Administered 2017-08-24: 30 mg via INTRAVENOUS
  Administered 2017-08-24: 50 mg via INTRAVENOUS

## 2017-08-24 MED ORDER — SODIUM CHLORIDE 0.9 % IV SOLN
INTRAVENOUS | Status: DC
  Administered 2017-08-24: 13:00:00 via INTRAVENOUS

## 2017-08-24 MED ORDER — PROPOFOL 500 MG/50ML IV EMUL
INTRAVENOUS | Status: DC | PRN
  Administered 2017-08-24: 175 ug/kg/min via INTRAVENOUS

## 2017-08-24 MED ORDER — METOPROLOL TARTRATE 5 MG/5ML IV SOLN
5.0000 mg | Freq: Once | INTRAVENOUS | Status: AC
  Administered 2017-08-24: 5 mg via INTRAVENOUS
  Filled 2017-08-24: qty 5

## 2017-08-24 MED ORDER — LIDOCAINE HCL (CARDIAC) 20 MG/ML IV SOLN
INTRAVENOUS | Status: DC | PRN
  Administered 2017-08-24: 50 mg via INTRAVENOUS

## 2017-08-24 NOTE — Anesthesia Procedure Notes (Addendum)
Date/Time: 08/24/2017 1:32 PM Performed by: Johnna Acosta, CRNA Pre-anesthesia Checklist: Patient identified, Emergency Drugs available, Suction available, Patient being monitored and Timeout performed Patient Re-evaluated:Patient Re-evaluated prior to induction Oxygen Delivery Method: Nasal cannula Preoxygenation: Pre-oxygenation with 100% oxygen

## 2017-08-24 NOTE — H&P (Signed)
Outpatient short stay form Pre-procedure 08/24/2017 1:00 PM Teodoro K. Alice Reichert, M.D.  Primary Physician: Halina Maidens, M.D.  Reason for visit: Family hx of colon polyps.  History of present illness:  47 y/o female with hx of Hashimoto's thyroiditis on Thyroid supplementation presents for colonoscopy for increased risk screening; patient's mother had hx of colon polyps on colonoscopy. Patient denies change in bowel habits, rectal bleeding or weight loss.     Current Facility-Administered Medications:  .  0.9 %  sodium chloride infusion, , Intravenous, Continuous, [place], Benay Pike, MD  Medications Prior to Admission  Medication Sig Dispense Refill Last Dose  . Cetirizine HCl (ZYRTEC ALLERGY) 10 MG CAPS Take 1 capsule by mouth daily.    Past Week at Unknown time  . clonazePAM (KLONOPIN) 0.5 MG tablet Take 0.5-1 mg by mouth 3 (three) times daily as needed for anxiety.   Past Week at Unknown time  . DULoxetine (CYMBALTA) 30 MG capsule Take 90 mg by mouth daily. Reported on 07/07/2015   Past Week at Unknown time  . propranolol ER (INDERAL LA) 60 MG 24 hr capsule Take 1 capsule by mouth daily.   08/23/2017 at Unknown time  . rizatriptan (MAXALT) 10 MG tablet Take 10 mg by mouth as needed for migraine. May repeat in 2 hours if needed   Past Week at Unknown time  . thyroid (NATURE-THROID) 130 MG tablet Take 130 mg by mouth daily.    08/23/2017 at Unknown time  . traMADol (ULTRAM) 50 MG tablet Take 50 mg by mouth daily as needed.    Past Week at Unknown time  . butalbital-acetaminophen-caffeine (FIORICET) 50-325-40 MG tablet Take 1 tablet by mouth every 6 (six) hours as needed.    PRN at PRN  . diclofenac (CATAFLAM) 50 MG tablet Take 50 mg by mouth daily as needed.    Not Taking at Unknown time  . meclizine (ANTIVERT) 25 MG tablet Take 1 tablet (25 mg total) by mouth 3 (three) times daily as needed for dizziness. (Patient not taking: Reported on 05/31/2017) 30 tablet 0 Not Taking at Unknown time  .  oxyCODONE-acetaminophen (PERCOCET) 5-325 MG tablet Take 1-2 tablets by mouth every 8 (eight) hours as needed. (Patient not taking: Reported on 08/24/2017) 12 tablet 0 Not Taking at Unknown time  . predniSONE (DELTASONE) 10 MG tablet Take 6 on day 1, 5 on day 2, 4 on day 3, 3 on day 4, 2 on day 5 and 1 on day 1 then stop. (Patient not taking: Reported on 05/31/2017) 21 tablet 0 Completed Course at Unknown time  . SUMAtriptan (IMITREX) 100 MG tablet Take 100 mg by mouth daily as needed.    PRN at PRN     Allergies  Allergen Reactions  . Bee Venom Shortness Of Breath and Swelling  . Topiramate Swelling, Anxiety and Rash    "severe watery eyes"; "swelling and reddening of face"      Past Medical History:  Diagnosis Date  . Cancer ([place])   . Headache   . Sleep apnea   . Thyroid disorder     Review of systems:      Physical Exam  Gen: Alert, oriented. Appears stated age.  HEENT: [address]/AT. PERRLA. Lungs: CTA, no wheezes. CV: RR nl S1, S2. Somewhat tachycardic, HR 140's Abd: soft, benign, no masses. BS+ Ext: No edema. Pulses 2+    Planned procedures: Colonoscopy. Discussed with anesthesia regarding tachy from possible thyroid hormone imbalance. Dr. Theodore Demark of anesthesia plans IV metoprolol to assist  with proceeding with colonoscopy.  The patient understands the nature of the planned procedure, indications, risks, alternatives and potential complications including but not limited to bleeding, infection, perforation, damage to internal organs and possible oversedation/side effects from anesthesia. The patient agrees and gives consent to proceed.  Please refer to procedure notes for findings, recommendations and patient disposition/instructions.    Teodoro K. Alice Reichert, M.D. Gastroenterology 08/24/2017  1:00 PM

## 2017-08-24 NOTE — Transfer of Care (Signed)
Immediate Anesthesia Transfer of Care Note  Patient: Marilyn Robertson  Procedure(s) Performed: COLONOSCOPY WITH PROPOFOL (N/A )  Patient Location: PACU  Anesthesia Type:General  Level of Consciousness: sedated  Airway & Oxygen Therapy: Patient Spontanous Breathing and Patient connected to nasal cannula oxygen  Post-op Assessment: Report given to RN and Post -op Vital signs reviewed and stable  Post vital signs: Reviewed and stable  Last Vitals:  Vitals:   08/24/17 1322 08/24/17 1349  BP: 127/81 109/69  Pulse: 97 (!) 110  Resp: 16 (!) 21  Temp:  37.2 C  SpO2: 96% 94%    Last Pain:  Vitals:   08/24/17 1349  TempSrc: Tympanic      Patients Stated Pain Goal: 0 (96/78/93 8101)  Complications: No apparent anesthesia complications

## 2017-08-24 NOTE — Anesthesia Post-op Follow-up Note (Signed)
Anesthesia QCDR form completed.        

## 2017-08-24 NOTE — Anesthesia Preprocedure Evaluation (Signed)
Anesthesia Evaluation  Patient identified by MRN, date of birth, ID band Patient awake    Reviewed: Allergy & Precautions, H&P , NPO status , Patient's Chart, lab work & pertinent test results  History of Anesthesia Complications Negative for: history of anesthetic complications  Airway Mallampati: III  TM Distance: <3 FB Neck ROM: limited    Dental  (+) Chipped   Pulmonary neg shortness of breath, sleep apnea and Continuous Positive Airway Pressure Ventilation ,           Cardiovascular Exercise Tolerance: Good + Peripheral Vascular Disease  + dysrhythmias      Neuro/Psych  Headaches, PSYCHIATRIC DISORDERS Depression  Neuromuscular disease    GI/Hepatic Neg liver ROS, GERD  Medicated and Controlled,  Endo/Other  Hypothyroidism   Renal/GU negative Renal ROS  negative genitourinary   Musculoskeletal   Abdominal   Peds  Hematology negative hematology ROS (+)   Anesthesia Other Findings Past Medical History: No date: Cancer ([place]) No date: Headache No date: Sleep apnea No date: Thyroid disorder  Past Surgical History: No date: ABDOMINAL HYSTERECTOMY 1996: BREAST REDUCTION SURGERY No date: BREAST SURGERY 2016: ROBOTIC ASSISTED TOTAL HYSTERECTOMY     Comment:  atypical endometrial hyperplasia  BMI    Body Mass Index:  44.81 kg/m      Reproductive/Obstetrics negative OB ROS                             Anesthesia Physical Anesthesia Plan  ASA: III  Anesthesia Plan: General   Post-op Pain Management:    Induction: Intravenous  PONV Risk Score and Plan: Propofol infusion  Airway Management Planned: Natural Airway and Nasal Cannula  Additional Equipment:   Intra-op Plan:   Post-operative Plan:   Informed Consent: I have reviewed the patients History and Physical, chart, labs and discussed the procedure including the risks, benefits and alternatives for the proposed  anesthesia with the patient or authorized representative who has indicated his/her understanding and acceptance.   Dental Advisory Given  Plan Discussed with: Anesthesiologist, CRNA and Surgeon  Anesthesia Plan Comments: (Patient presents with tachycardia, 140's sinus, she did not take her rate control agent last night, plan to establish IV access and then try to rate control with IV beta blocker PreOp.  Will proceed if we are able to rate control her.   Patient consented for risks of anesthesia including but not limited to:  - adverse reactions to medications - risk of intubation if required - damage to teeth, lips or other oral mucosa - sore throat or hoarseness - Damage to heart, brain, lungs or loss of life  Patient voiced understanding.)        Anesthesia Quick Evaluation

## 2017-08-24 NOTE — Anesthesia Postprocedure Evaluation (Signed)
Anesthesia Post Note  Patient: Marilyn Robertson  Procedure(s) Performed: COLONOSCOPY WITH PROPOFOL (N/A )  Patient location during evaluation: PACU Anesthesia Type: General Level of consciousness: awake and alert Pain management: pain level controlled Vital Signs Assessment: post-procedure vital signs reviewed and stable Respiratory status: spontaneous breathing, nonlabored ventilation, respiratory function stable and patient connected to nasal cannula oxygen Cardiovascular status: blood pressure returned to baseline and stable Postop Assessment: no apparent nausea or vomiting Anesthetic complications: no     Last Vitals:  Vitals:   08/24/17 1359 08/24/17 1409  BP: 122/87 119/83  Pulse: (!) 105 92  Resp: (!) 22 16  Temp:    SpO2: 100% 100%    Last Pain:  Vitals:   08/24/17 1349  TempSrc: Tympanic                 Molli Barrows

## 2017-08-24 NOTE — Op Note (Signed)
[facility] Gastroenterology Patient Name: Marilyn Robertson Procedure Date: 08/24/2017 1:31 PM MRN: [id number] Account #: [id number] Date of Birth: January 01, 1971 Admit Type: Outpatient Age: 47 Room: 21 [facility] ENDO ROOM 4 Gender: Female Note Status: Finalized Procedure:            Colonoscopy Indications:          Colon cancer screening in patient at increased risk:                        Family history of 1st-degree relative with colon polyps Providers:            Benay Pike. Alice Reichert MD, MD Referring MD:         Halina Maidens, MD (Referring MD) Medicines:            Propofol per Anesthesia Complications:        No immediate complications. Procedure:            Pre-Anesthesia Assessment:                       - The risks and benefits of the procedure and the                        sedation options and risks were discussed with the                        patient. All questions were answered and informed                        consent was obtained.                       - Patient identification and proposed procedure were                        verified prior to the procedure by the nurse. The                        procedure was verified in the procedure room.                       - ASA Grade Assessment: III - A patient with severe                        systemic disease.                       - After reviewing the risks and benefits, the patient                        was deemed in satisfactory condition to undergo the                        procedure.                       After obtaining informed consent, the colonoscope was                        passed under direct vision. Throughout the procedure,  the patient's blood pressure, pulse, and oxygen                        saturations were monitored continuously. The                        Colonoscope was introduced through the anus and                        advanced to the the cecum, identified by  appendiceal                        orifice and ileocecal valve. The colonoscopy was                        performed without difficulty. The patient tolerated the                        procedure well. The quality of the bowel preparation                        was excellent. The ileocecal valve, appendiceal                        orifice, and rectum were photographed. Findings:      The perianal and digital rectal examinations were normal. Pertinent       negatives include normal sphincter tone and no palpable rectal lesions.      A few small-mouthed diverticula were found in the sigmoid colon. There       was no evidence of diverticular bleeding.      Non-bleeding internal hemorrhoids were found during retroflexion. The       hemorrhoids were Grade I (internal hemorrhoids that do not prolapse).      Non-bleeding internal hemorrhoids were found during retroflexion. The       hemorrhoids were Grade I (internal hemorrhoids that do not prolapse).      The exam was otherwise without abnormality. Impression:           - Mild diverticulosis in the sigmoid colon. There was                        no evidence of diverticular bleeding.                       - Non-bleeding internal hemorrhoids.                       - Non-bleeding internal hemorrhoids.                       - The examination was otherwise normal.                       - No specimens collected. Recommendation:       - Patient has a contact number available for                        emergencies. The signs and symptoms of potential                        delayed complications were discussed with  the patient.                        Return to normal activities tomorrow. Written discharge                        instructions were provided to the patient.                       - Resume previous diet.                       - Continue present medications.                       - Repeat colonoscopy in 5 years for screening purposes.                        - The findings and recommendations were discussed with                        the patient and their spouse. Procedure Code(s):    --- Professional ---                       H2094, Colorectal cancer screening; colonoscopy on                        individual at high risk Diagnosis Code(s):    --- Professional ---                       K57.30, Diverticulosis of large intestine without                        perforation or abscess without bleeding                       K64.0, First degree hemorrhoids                       Z83.71, Family history of colonic polyps CPT copyright 2016 American Medical Association. All rights reserved. The codes documented in this report are preliminary and upon coder review may  be revised to meet current compliance requirements. Efrain Sella MD, MD 08/24/2017 1:48:17 PM This report has been signed electronically. Number of Addenda: 0 Note Initiated On: 08/24/2017 1:31 PM Scope Withdrawal Time: 0 hours 6 minutes 10 seconds  Total Procedure Duration: 0 hours 8 minutes 5 seconds       [facility]

## 2017-08-24 NOTE — Interval H&P Note (Signed)
History and Physical Interval Note:  08/24/2017 1:04 PM  Marilyn Robertson  has presented today for surgery, with the diagnosis of FM HX COLON POLYPS  The various methods of treatment have been discussed with the patient and family. After consideration of risks, benefits and other options for treatment, the patient has consented to  Procedure(s): COLONOSCOPY WITH PROPOFOL (N/A) as a surgical intervention .  The patient's history has been reviewed, patient examined, no change in status, stable for surgery.  I have reviewed the patient's chart and labs.  Questions were answered to the patient's satisfaction.     [place], [place]

## 2017-08-24 NOTE — Progress Notes (Signed)
On admission HR 140-150 NSR on monitor. Anesthesia Dr. Amie Critchley notified and orders for Metoprolol given. HR down to 97 after Metoprolol 5 mg IV. Patient on 3 lead continuous EKG, q 4min bp monitor and ox monitor during IV Metoprolol.

## 2017-08-25 ENCOUNTER — Encounter: Payer: Self-pay | Admitting: Internal Medicine

## 2018-08-14 ENCOUNTER — Encounter: Payer: Self-pay | Admitting: Internal Medicine

## 2018-08-14 ENCOUNTER — Other Ambulatory Visit: Payer: Self-pay

## 2018-08-14 ENCOUNTER — Ambulatory Visit: Payer: BC Managed Care – PPO | Admitting: Internal Medicine

## 2018-08-14 VITALS — BP 118/78 | HR 74 | Temp 97.9°F | Ht 62.0 in | Wt 248.0 lb

## 2018-08-14 DIAGNOSIS — J028 Acute pharyngitis due to other specified organisms: Secondary | ICD-10-CM | POA: Diagnosis not present

## 2018-08-14 MED ORDER — AZITHROMYCIN 250 MG PO TABS: ORAL_TABLET | ORAL | 0 refills | Status: AC

## 2018-08-14 NOTE — Progress Notes (Signed)
Date:  08/14/2018   Name:  Marilyn Robertson   DOB:  01/16/1971   MRN:  [id number]   Chief Complaint: Sore Throat (Started with sore throat late Friday night. Cough and clear/ some blood production. Eating/ swallowing is painful because of throat. Patient said there is a lot of sickness going around her office. )  Sore Throat   This is a new problem. The current episode started in the past 7 days. The problem has been unchanged. Neither side of throat is experiencing more pain than the other. There has been no fever. The pain is moderate. Associated symptoms include ear pain, swollen glands and trouble swallowing. Pertinent negatives include no abdominal pain, drooling, ear discharge, headaches or shortness of breath. She has had no exposure to strep or mono. She has tried acetaminophen and gargles for the symptoms. The treatment provided mild relief.    Review of Systems  Constitutional: Negative for chills, fatigue and fever.  HENT: Positive for ear pain, sinus pressure and trouble swallowing. Negative for drooling and ear discharge.   Respiratory: Negative for chest tightness and shortness of breath.   Cardiovascular: Negative for chest pain.  Gastrointestinal: Negative for abdominal pain.  Allergic/Immunologic: Negative for environmental allergies.  Neurological: Negative for dizziness and headaches.    Patient Active Problem List   Diagnosis Date Noted  . Pre-diabetes 07/07/2015  . Clinical depression 06/29/2015  . Headache, migraine 06/29/2015  . Acid reflux 06/29/2015  . Carpal tunnel syndrome 06/29/2015  . Hypothyroidism 06/29/2015  . Paroxysmal digital cyanosis 06/29/2015  . OSA (obstructive sleep apnea) 06/29/2015  . Hyperlipidemia, mild 06/29/2015  . Malignant neoplasm of endometrium ([place]) 12/27/2014    Allergies  Allergen Reactions  . Bee Venom Shortness Of Breath and Swelling  . Topiramate Swelling, Anxiety and Rash    "severe watery eyes"; "swelling and  reddening of face"     Past Surgical History:  Procedure Laterality Date  . ABDOMINAL HYSTERECTOMY    . BREAST REDUCTION SURGERY  1996  . BREAST SURGERY    . COLONOSCOPY WITH PROPOFOL N/A 08/24/2017   Procedure: COLONOSCOPY WITH PROPOFOL;  Surgeon: Toledo, Benay Pike, MD;  Location: [facility] ENDOSCOPY;  Service: Gastroenterology;  Laterality: N/A;  . ROBOTIC ASSISTED TOTAL HYSTERECTOMY  2016   atypical endometrial hyperplasia    Social History   Tobacco Use  . Smoking status: Never Smoker  . Smokeless tobacco: Never Used  Substance Use Topics  . Alcohol use: Yes    Alcohol/week: 2.0 standard drinks    Types: 2 Standard drinks or equivalent per week    Comment: occasional  . Drug use: No     Medication list has been reviewed and updated.  Current Meds  Medication Sig  . butalbital-acetaminophen-caffeine (FIORICET) 50-325-40 MG tablet Take 1 tablet by mouth every 6 (six) hours as needed.   . DULoxetine (CYMBALTA) 30 MG capsule Take 90 mg by mouth daily. Reported on 07/07/2015  . propranolol ER (INDERAL LA) 60 MG 24 hr capsule Take 1 capsule by mouth daily.  . rizatriptan (MAXALT) 10 MG tablet Take 10 mg by mouth as needed for migraine. May repeat in 2 hours if needed  . SUMAtriptan (IMITREX) 100 MG tablet Take 100 mg by mouth daily as needed.   . thyroid (NATURE-THROID) 130 MG tablet Take 130 mg by mouth daily. NP- Thyroid    PHQ 2/9 Scores 08/14/2018  PHQ - 2 Score 1    Physical Exam Constitutional:      Appearance:  She is well-developed.  HENT:     Right Ear: Ear canal and external ear normal. Tympanic membrane is not erythematous or retracted.     Left Ear: Ear canal and external ear normal. Tympanic membrane is not erythematous or retracted.     Nose:     Right Sinus: Maxillary sinus tenderness present. No frontal sinus tenderness.     Left Sinus: Maxillary sinus tenderness present. No frontal sinus tenderness.     Mouth/Throat:     Mouth: No oral lesions.     Pharynx:  Uvula midline. Posterior oropharyngeal erythema and uvula swelling present. No oropharyngeal exudate.     Tonsils: No tonsillar exudate.  Cardiovascular:     Rate and Rhythm: Normal rate and regular rhythm.     Heart sounds: Normal heart sounds.  Pulmonary:     Breath sounds: Normal breath sounds. No wheezing or rales.  Lymphadenopathy:     Cervical: No cervical adenopathy.  Neurological:     Mental Status: She is alert and oriented to person, place, and time.     BP 118/78   Pulse 74   Temp 97.9 F (36.6 C) (Oral)   Ht 5\' 2"  (1.575 m)   Wt 248 lb (112.5 kg)   SpO2 95%   BMI 45.36 kg/m   Assessment and Plan: 1. Pharyngitis due to other organism Continue tylenol, lozenges, gargles - azithromycin (ZITHROMAX Z-PAK) 250 MG tablet; UAD  Dispense: 6 each; Refill: 0   Partially dictated using Editor, commissioning. Any errors are unintentional.  Halina Maidens, MD [facility]  08/14/2018

## 2018-08-28 ENCOUNTER — Ambulatory Visit: Payer: BC Managed Care – PPO | Admitting: Internal Medicine

## 2018-08-28 ENCOUNTER — Other Ambulatory Visit: Payer: Self-pay

## 2018-08-28 ENCOUNTER — Encounter: Payer: Self-pay | Admitting: Internal Medicine

## 2018-08-28 VITALS — BP 112/68 | HR 70 | Ht 62.0 in | Wt 248.0 lb

## 2018-08-28 DIAGNOSIS — M62838 Other muscle spasm: Secondary | ICD-10-CM | POA: Diagnosis not present

## 2018-08-28 DIAGNOSIS — J0100 Acute maxillary sinusitis, unspecified: Secondary | ICD-10-CM | POA: Diagnosis not present

## 2018-08-28 MED ORDER — CYCLOBENZAPRINE HCL 5 MG PO TABS: 5.0000 mg | ORAL_TABLET | Freq: Every day | ORAL | 0 refills | Status: DC

## 2018-08-28 MED ORDER — AZITHROMYCIN 250 MG PO TABS: ORAL_TABLET | ORAL | 0 refills | Status: AC

## 2018-08-28 NOTE — Progress Notes (Signed)
Date:  08/28/2018   Name:  Marilyn Robertson   DOB:  [birth year]   MRN:  [id number]   Chief Complaint: Neck Pain (Started about a week ago. Feels like a tension headache but into neck. In center of neck at base of skull. )  Neck Pain   This is a new problem. The current episode started in the past 7 days. The problem occurs daily. The problem has been unchanged. The pain is associated with nothing. The pain is present in the left side. The quality of the pain is described as aching. The pain is mild. The symptoms are aggravated by twisting. Pertinent negatives include no chest pain, fever, numbness, trouble swallowing or weakness. She has tried NSAIDs, heat and muscle relaxants for the symptoms. The treatment provided moderate (left shoulder area improved but base of skull still tight and tender) relief.  Sinus Problem  This is a recurrent problem. The current episode started 1 to 4 weeks ago. The problem has been gradually improving since onset. There has been no fever. Associated symptoms include congestion, neck pain and sinus pressure. Pertinent negatives include no chills, coughing, shortness of breath or sore throat. Past treatments include antibiotics (zpak improved sx partially).    Review of Systems  Constitutional: Negative for chills, fatigue and fever.  HENT: Positive for congestion, sinus pressure and sinus pain. Negative for sore throat, tinnitus and trouble swallowing.   Respiratory: Negative for cough and shortness of breath.   Cardiovascular: Negative for chest pain.  Musculoskeletal: Positive for neck pain and neck stiffness.  Neurological: Negative for weakness and numbness.  Psychiatric/Behavioral: Negative for sleep disturbance.    Patient Active Problem List   Diagnosis Date Noted  . Pre-diabetes 07/07/2015  . Clinical depression 06/29/2015  . Headache, migraine 06/29/2015  . Acid reflux 06/29/2015  . Carpal tunnel syndrome 06/29/2015  . Hypothyroidism 06/29/2015    . Paroxysmal digital cyanosis 06/29/2015  . OSA (obstructive sleep apnea) 06/29/2015  . Hyperlipidemia, mild 06/29/2015  . Malignant neoplasm of endometrium ([place]) 12/27/2014    Allergies  Allergen Reactions  . Bee Venom Shortness Of Breath and Swelling  . Topiramate Swelling, Anxiety and Rash    "severe watery eyes"; "swelling and reddening of face"     Past Surgical History:  Procedure Laterality Date  . ABDOMINAL HYSTERECTOMY    . BREAST REDUCTION SURGERY  1996  . BREAST SURGERY    . COLONOSCOPY WITH PROPOFOL N/A 08/24/2017   Procedure: COLONOSCOPY WITH PROPOFOL;  Surgeon: Toledo, Benay Pike, MD;  Location: [facility] ENDOSCOPY;  Service: Gastroenterology;  Laterality: N/A;  . ROBOTIC ASSISTED TOTAL HYSTERECTOMY  2016   atypical endometrial hyperplasia    Social History   Tobacco Use  . Smoking status: Never Smoker  . Smokeless tobacco: Never Used  Substance Use Topics  . Alcohol use: Yes    Alcohol/week: 2.0 standard drinks    Types: 2 Standard drinks or equivalent per week    Comment: occasional  . Drug use: No     Medication list has been reviewed and updated.  Current Meds  Medication Sig  . butalbital-acetaminophen-caffeine (FIORICET) 50-325-40 MG tablet Take 1 tablet by mouth every 6 (six) hours as needed.   . DULoxetine (CYMBALTA) 30 MG capsule Take 90 mg by mouth daily. Reported on 07/07/2015  . propranolol ER (INDERAL LA) 60 MG 24 hr capsule Take 1 capsule by mouth daily.  . rizatriptan (MAXALT) 10 MG tablet Take 10 mg by mouth as needed for migraine.  May repeat in 2 hours if needed  . SUMAtriptan (IMITREX) 100 MG tablet Take 100 mg by mouth daily as needed.   . thyroid (NATURE-THROID) 130 MG tablet Take 130 mg by mouth daily. NP- Thyroid    PHQ 2/9 Scores 08/28/2018 08/14/2018  PHQ - 2 Score 0 1    Physical Exam Vitals signs and nursing note reviewed.  Constitutional:      General: She is not in acute distress.    Appearance: She is well-developed.  HENT:      Head: Normocephalic and atraumatic.     Right Ear: Ear canal and external ear normal. Tympanic membrane is not erythematous or retracted.     Left Ear: Ear canal and external ear normal. Tympanic membrane is not erythematous or retracted.     Nose: Congestion present.     Right Sinus: Maxillary sinus tenderness and frontal sinus tenderness present.     Left Sinus: Maxillary sinus tenderness and frontal sinus tenderness present.     Mouth/Throat:     Mouth: Mucous membranes are moist. No oral lesions.     Pharynx: Uvula midline. No oropharyngeal exudate or posterior oropharyngeal erythema.  Neck:     Musculoskeletal: Normal range of motion and neck supple.  Cardiovascular:     Rate and Rhythm: Normal rate and regular rhythm.     Heart sounds: Normal heart sounds.  Pulmonary:     Effort: Pulmonary effort is normal. No respiratory distress.     Breath sounds: Normal breath sounds. No wheezing or rales.  Musculoskeletal: Normal range of motion.     Cervical back: She exhibits tenderness and spasm. She exhibits normal range of motion.  Lymphadenopathy:     Cervical: No cervical adenopathy.  Skin:    General: Skin is warm and dry.     Findings: No rash.  Neurological:     Mental Status: She is alert and oriented to person, place, and time.     Sensory: Sensation is intact.     Deep Tendon Reflexes:     Reflex Scores:      Bicep reflexes are 2+ on the right side and 2+ on the left side. Psychiatric:        Behavior: Behavior normal.        Thought Content: Thought content normal.     BP 112/68   Pulse 70   Ht 5\' 2"  (1.575 m)   Wt 248 lb (112.5 kg)   SpO2 98%   BMI 45.36 kg/m   Assessment and Plan: 1. Muscle spasms of neck Continue advil, heat and flexeril - cyclobenzaprine (FLEXERIL) 5 MG tablet; Take 1 tablet (5 mg total) by mouth at bedtime.  Dispense: 30 tablet; Refill: 0  2. Acute non-recurrent maxillary sinusitis Second course of zpak recommended Use flonase or  sudafed to help decongest - azithromycin (ZITHROMAX Z-PAK) 250 MG tablet; UAD  Dispense: 6 each; Refill: 0   Partially dictated using Editor, commissioning. Any errors are unintentional.  Halina Maidens, MD [facility]  08/28/2018

## 2019-01-09 IMAGING — CT CT HEAD W/O CM
5 of 6 series · 16 of 47 positions shown, 17 images · non-contrast
Comparison: None.

CLINICAL DATA: Pain following fall. Nausea and dizziness.
Short-term memory loss

EXAM:
CT HEAD WITHOUT CONTRAST
TECHNIQUE: Contiguous axial images were obtained from the base of the skull
through the vertex without intravenous contrast.

[Series 2: head wo · axial · 0.41mm/px · z∈[+328,+373]mm · 2 of 29 slices shown, 3 images (1 of 2)]
[im 10/29  brain]
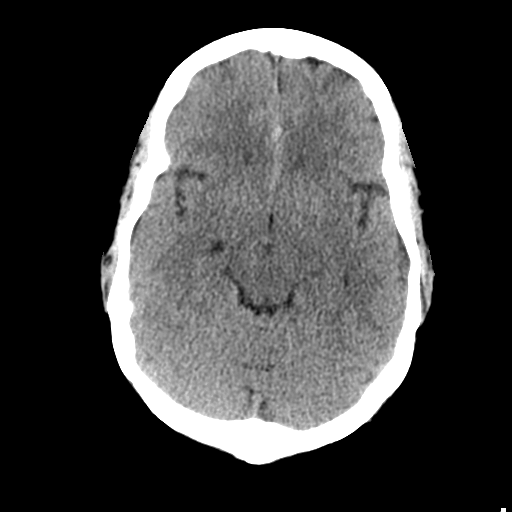
[im 10/29  bone]
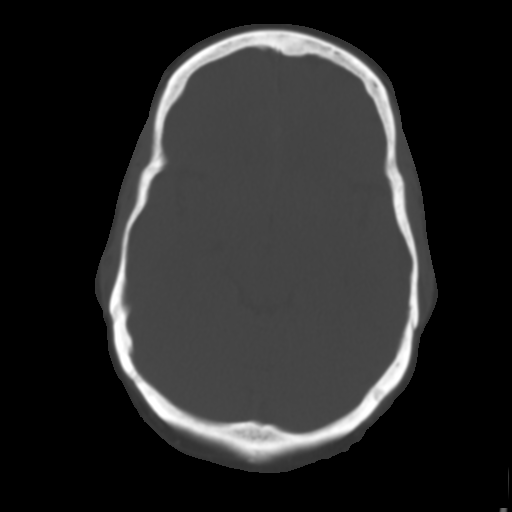
[im 19/29  brain]
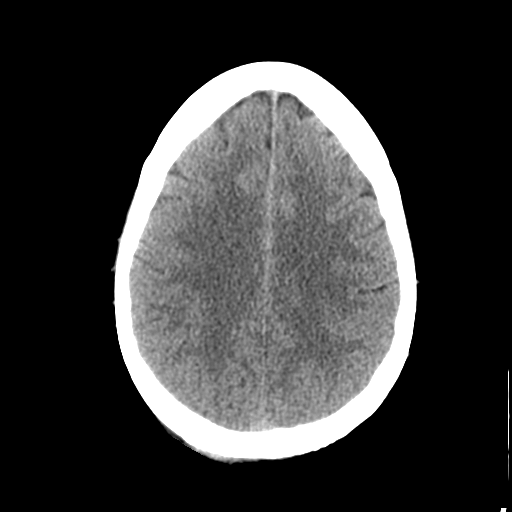

[Series 3: head bone · axial · 0.41mm/px · z∈[+297,+383]mm · 6 of 73 slices shown]
[im 8/73  bone]
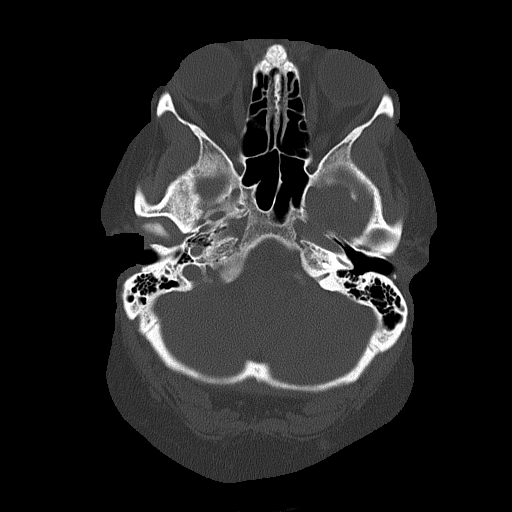
[im 15/73  bone]
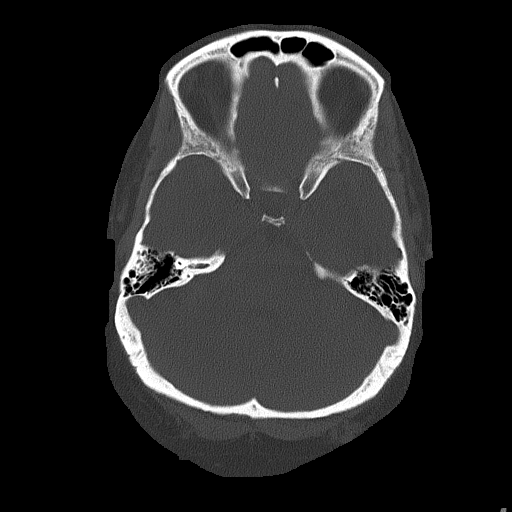
[im 22/73  bone]
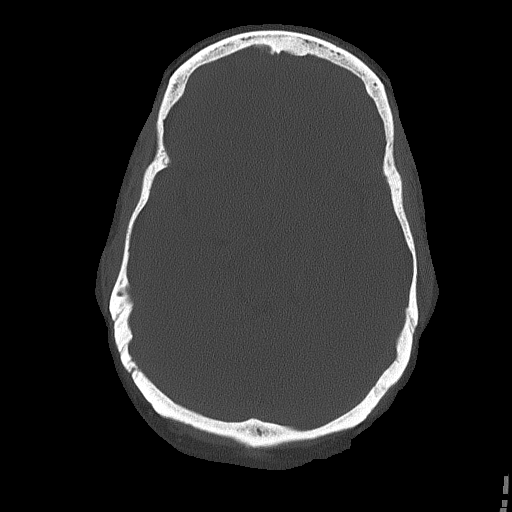
[im 29/73  bone]
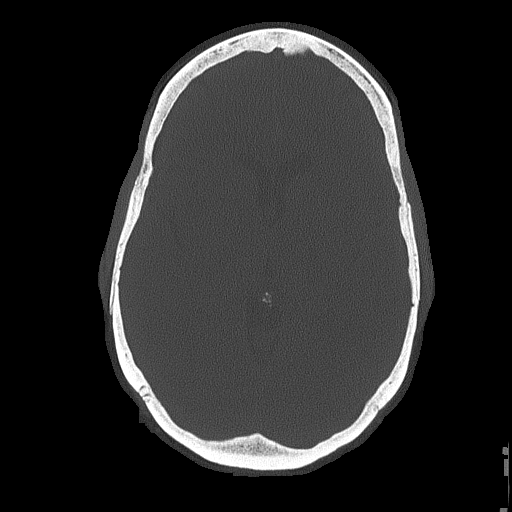
[im 44/73  bone]
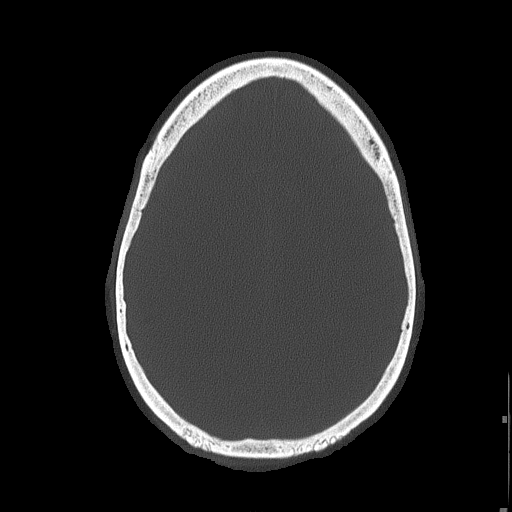
[im 51/73  bone]
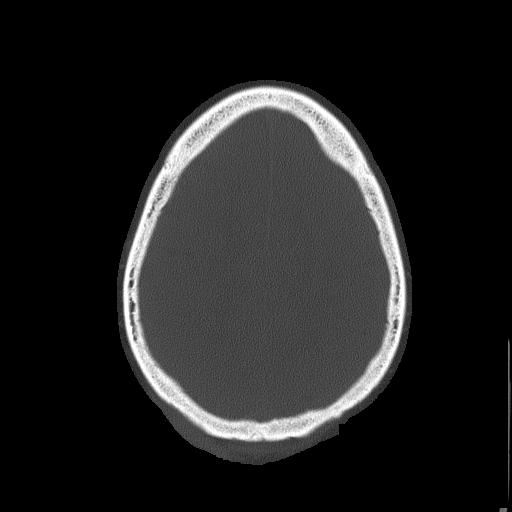

[Series 4: head wo · axial · 0.33mm/px · z∈[+299,+344]mm · 2 of 29 slices shown (2 of 2)]
[im 10/29  brain]
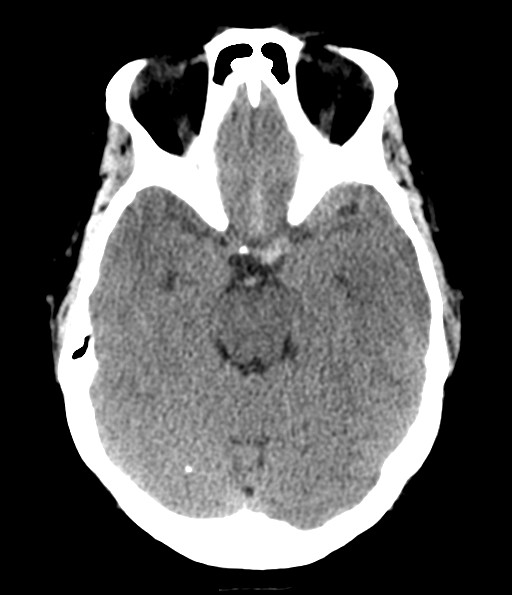
[im 19/29  brain]
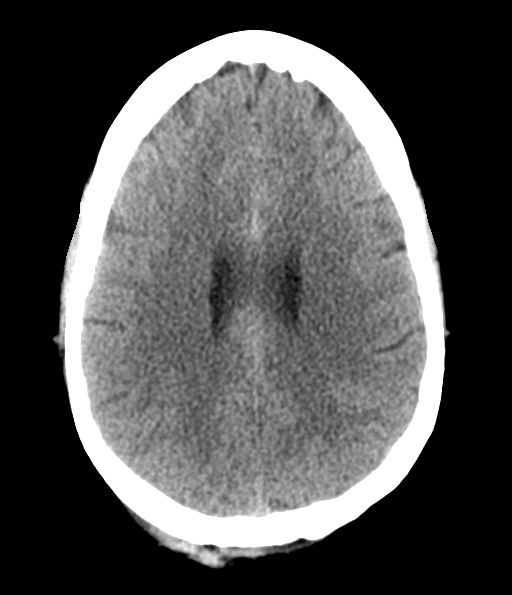

[Series 6: coronal soft tissue · coronal · 0.28mm/px · 3 of 65 slices shown]
[im 22/65  brain]
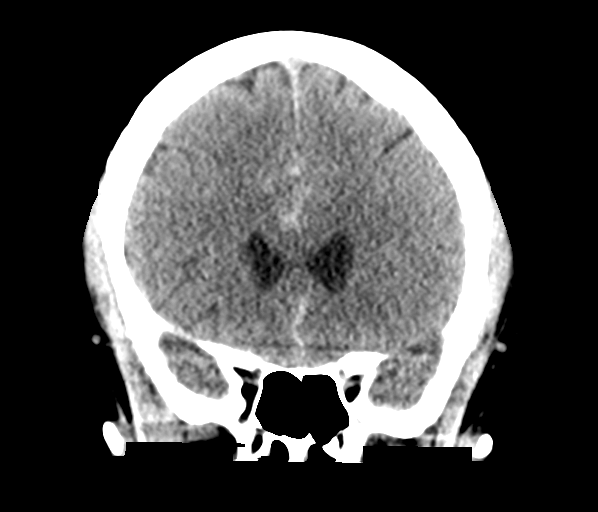
[im 29/65  brain]
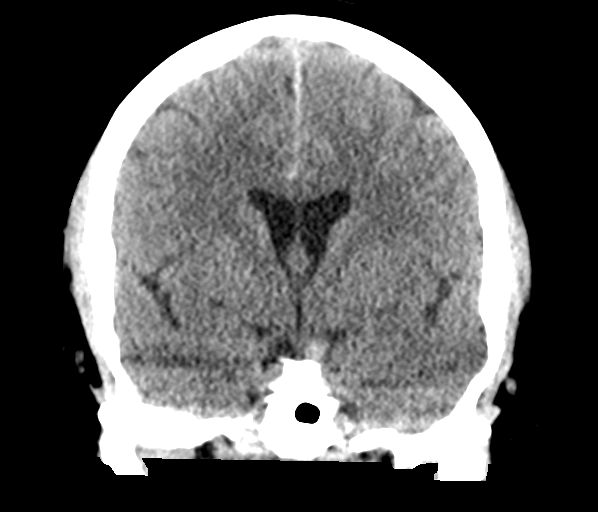
[im 36/65  brain]
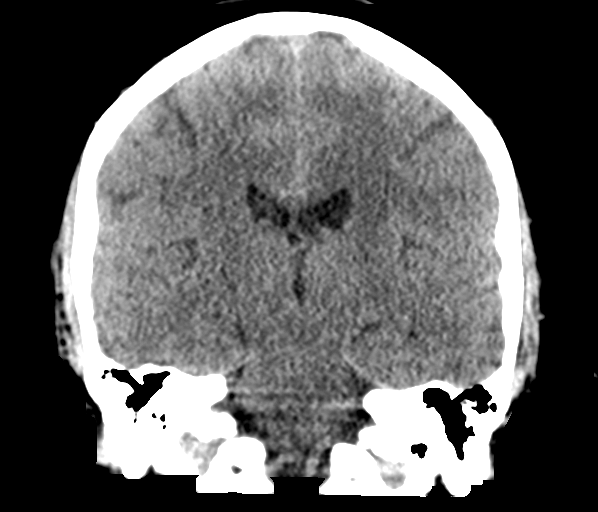

[Series 7: sagittal soft tissue · sagittal · 0.28mm/px · 3 of 56 slices shown]
[im 19/56  brain]
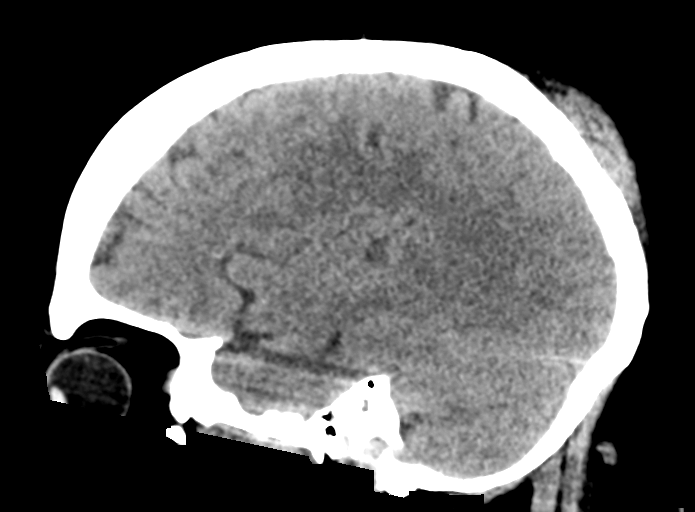
[im 28/56  brain]
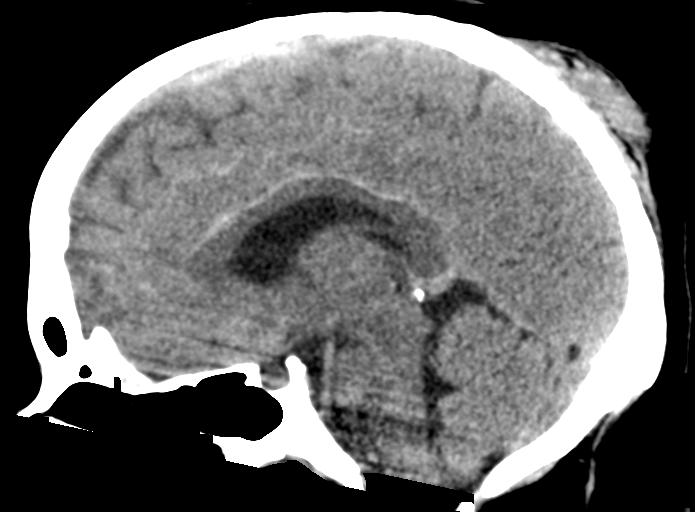
[im 37/56  brain]
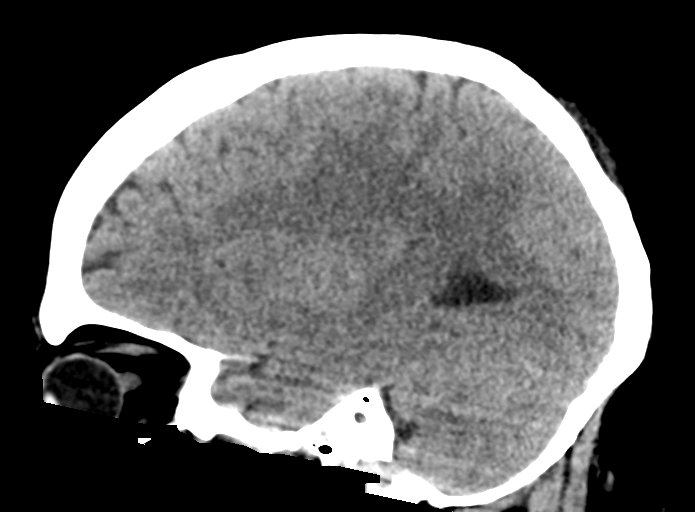

[16 of 47 positions shown; findings below may reference images not displayed]

FINDINGS: Brain: The ventricles are normal in size and configuration. There is
localized subdural hemorrhage along the anterior falx with minimal
subarachnoid hemorrhage to the left into the right of the anterior
falx. A small amount of this subarachnoid hemorrhage is also noted
in the superior aspect of the suprasellar cistern. No other acute
hemorrhage is evident. There is no midline shift. There is no mass
or edema. Elsewhere, gray-white compartments appear normal. No
evident acute infarct.

Vascular: No hyperdense vessel evident. No vascular calcification
appreciable.

Skull: Bony calvarium appears intact. There is a sizable right
superior parietal scalp hematoma.

Sinuses/Orbits: Visualized paranasal sinuses are clear. Visualized
orbits appear symmetric bilaterally.

Other: Mastoid air cells are clear.
IMPRESSION: Small anterior subdural hematoma with maximum thickness in the area
of hematoma of 3 mm. A small amount of subarachnoid hemorrhage is
noted in each frontal lobe just to the left and to the right of the
anterior falx. Subarachnoid hemorrhage is also noted marginally in
the superior suprasellar cistern. There is no appreciable mass
effect or edema. No midline shift. No acute infarct.

Right superior parietal scalp hematoma without fracture.

Critical Value/emergent results were called by telephone at the time
of interpretation on 05/31/2017 at [DATE] to Dr. SENAIDA LANDAU ,
who verbally acknowledged these results.

## 2019-01-09 IMAGING — CT CT CERVICAL SPINE W/O CM
3 of 4 series · 12 of 33 positions shown, 14 images · non-contrast
Comparison: None.

CLINICAL DATA: Fall on ice.  Headache. Initial encounter.

EXAM:
CT CERVICAL SPINE WITHOUT CONTRAST
TECHNIQUE: Multidetector CT imaging of the cervical spine was performed without
intravenous contrast. Multiplanar CT image reconstructions were also
generated.

[Series 4: sagittal bone · sagittal · 0.24mm/px · 5 of 61 slices shown, 6 images]
[im 21/61  bone]
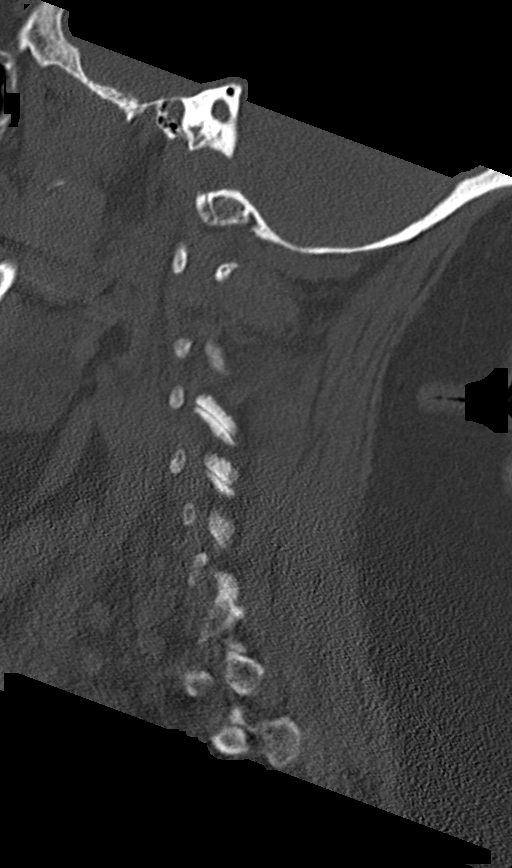
[im 26/61  bone]
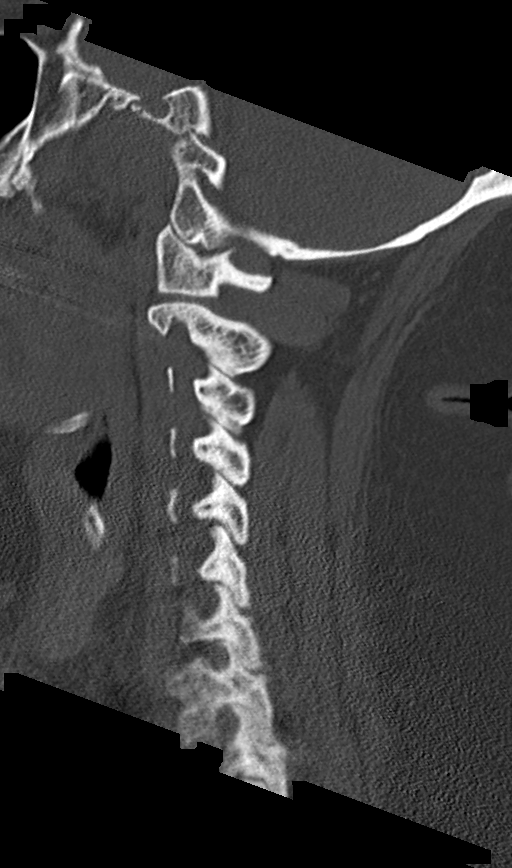
[im 31/61  soft-tissue]
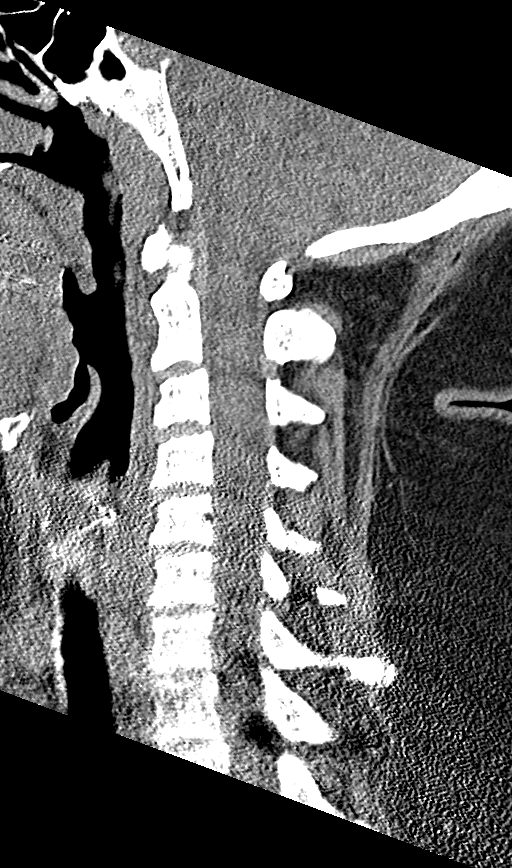
[im 31/61  bone]
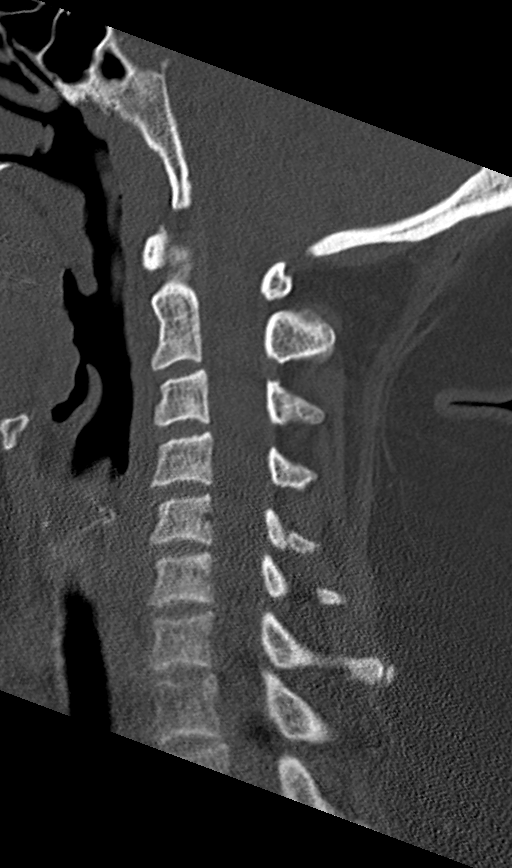
[im 36/61  bone]
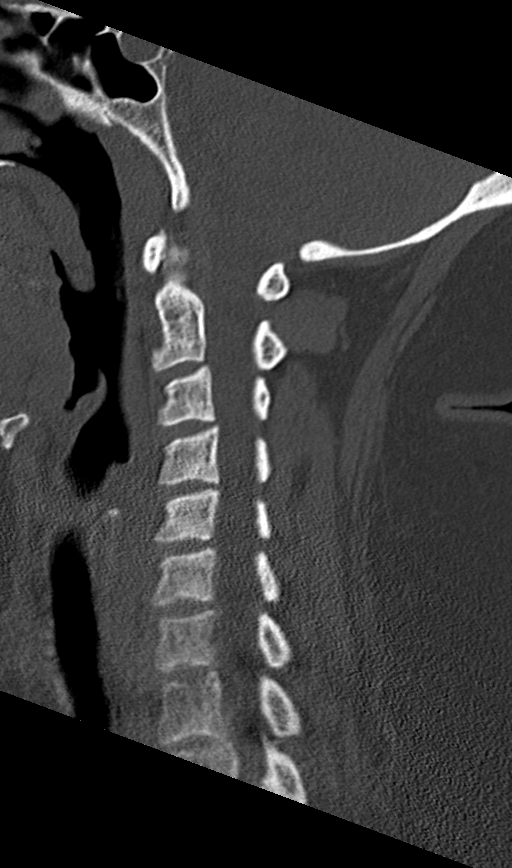
[im 41/61  bone]
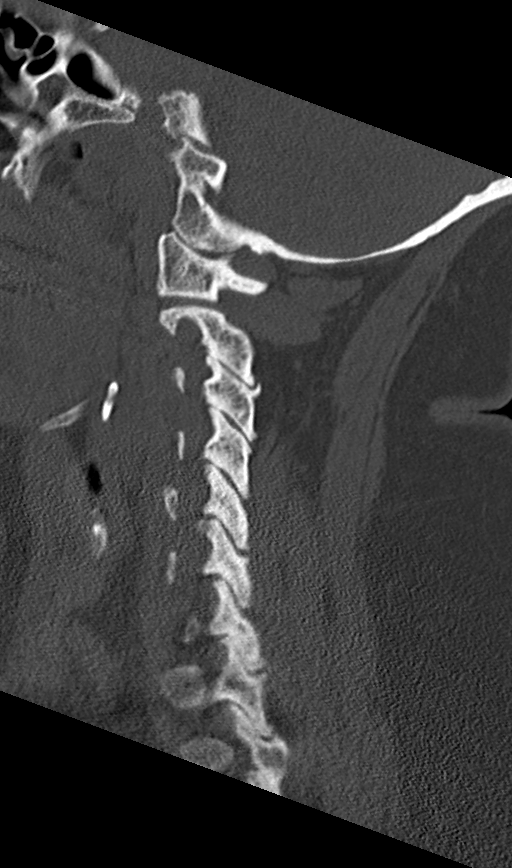

[Series 5: coronal bone · coronal · 0.23mm/px · 3 of 56 slices shown]
[im 12/56  bone]
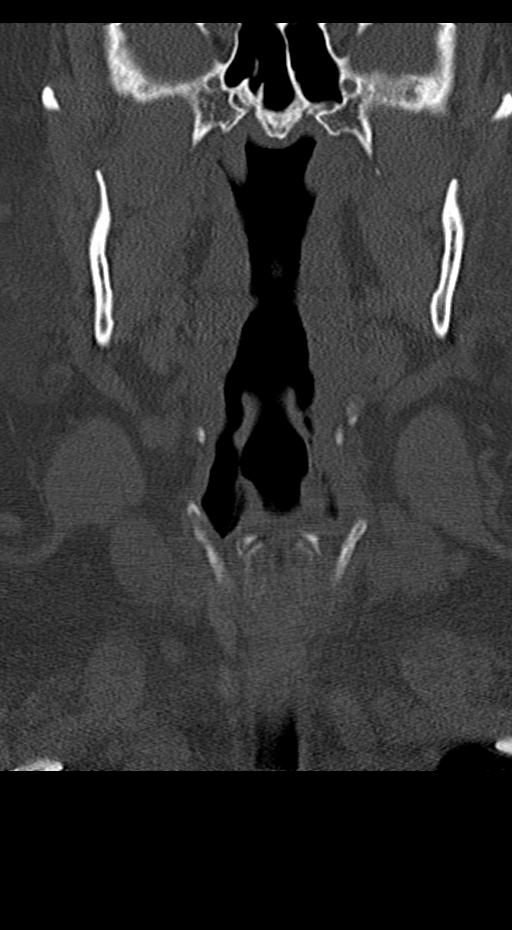
[im 23/56  bone]
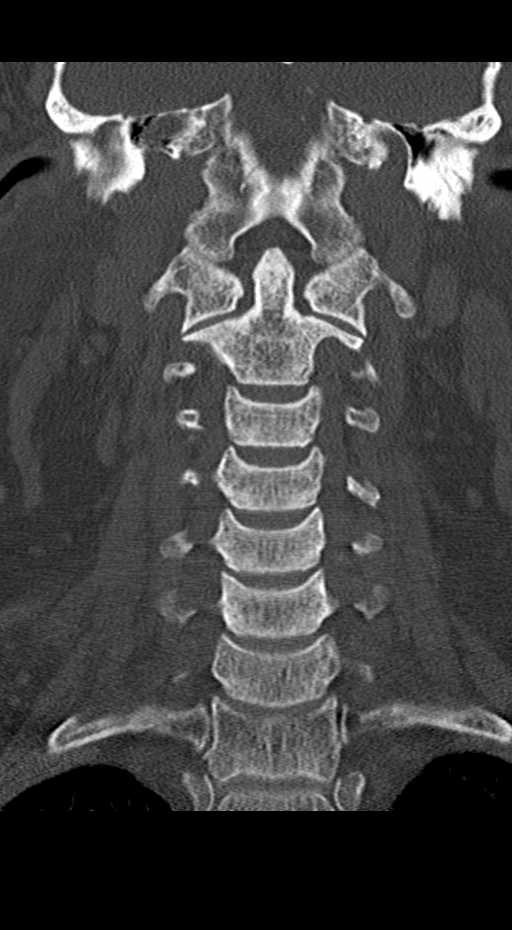
[im 34/56  bone]
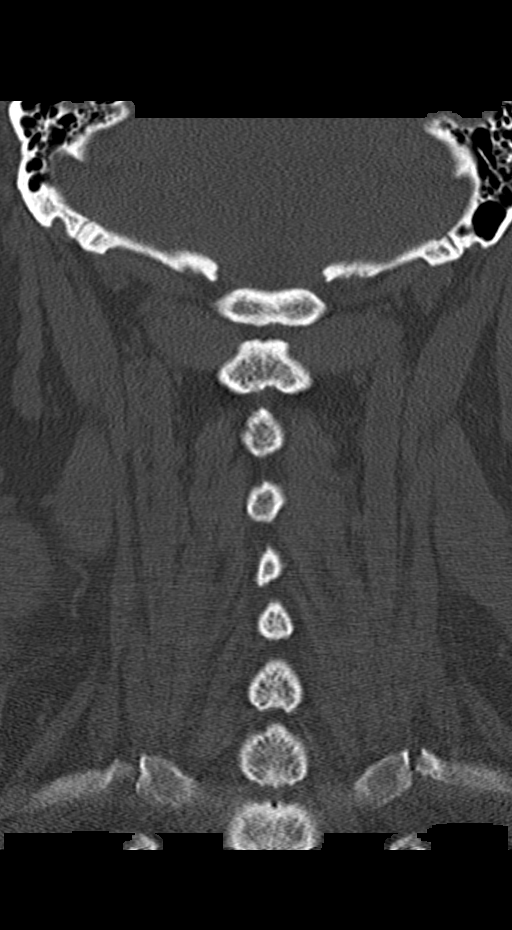

[Series 6: orthogonal bone · axial · 0.23mm/px · z∈[+368,+506]mm · 4 of 106 slices shown, 5 images]
[im 16/106  soft-tissue]
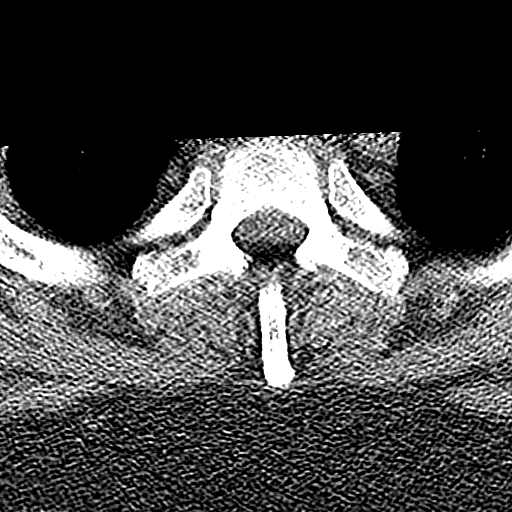
[im 16/106  bone]
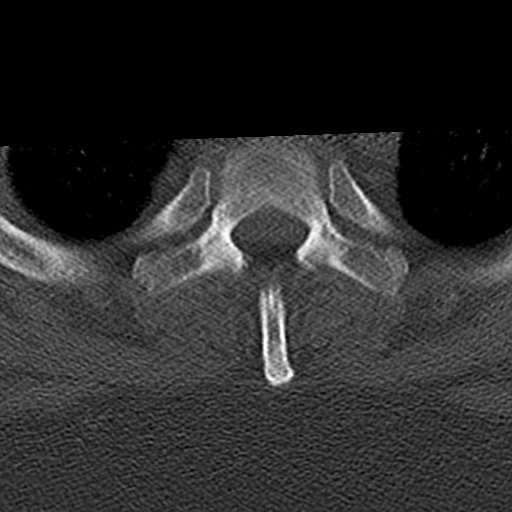
[im 46/106  bone]
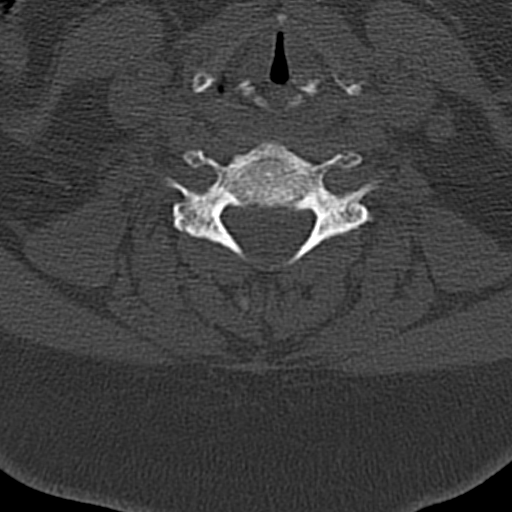
[im 61/106  bone]
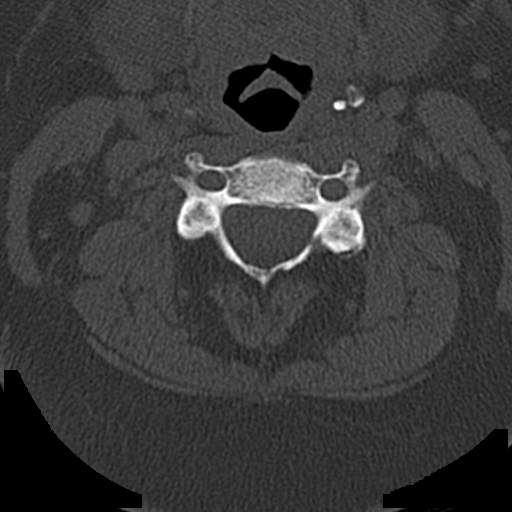
[im 91/106  bone]
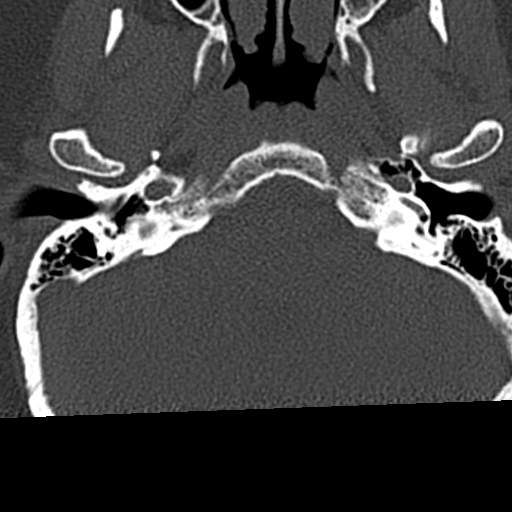

[12 of 33 positions shown; findings below may reference images not displayed]

FINDINGS: Alignment: No traumatic malalignment

Skull base and vertebrae: Negative for fracture

Soft tissues and spinal canal: No prevertebral fluid or swelling. No
visible canal hematoma.

Disc levels:  Mild facet spurring.  No evidence of impingement.

Upper chest: Negative
IMPRESSION: Negative for cervical spine fracture.

## 2019-09-14 LAB — HM MAMMOGRAPHY

## 2019-10-26 LAB — HEPATIC FUNCTION PANEL
ALT: 19 (ref 7–35)
AST: 16 (ref 13–35)
Alkaline Phosphatase: 122 (ref 25–125)

## 2019-10-26 LAB — BASIC METABOLIC PANEL
BUN: 11 (ref 4–21)
Creatinine: 0.7 (ref 0.5–1.1)
Glucose: 87

## 2019-10-26 LAB — LIPID PANEL
Cholesterol: 192 (ref 0–200)
HDL: 45 (ref 35–70)
LDL Cholesterol: 125
Triglycerides: 122 (ref 40–160)

## 2019-11-03 ENCOUNTER — Encounter: Payer: Self-pay | Admitting: Internal Medicine

## 2019-11-05 ENCOUNTER — Other Ambulatory Visit: Payer: Self-pay | Admitting: Internal Medicine

## 2019-11-05 ENCOUNTER — Encounter: Payer: Self-pay | Admitting: Internal Medicine

## 2019-11-06 ENCOUNTER — Telehealth: Payer: Self-pay

## 2019-11-06 NOTE — Telephone Encounter (Signed)
-----   Message from Glean Hess, MD sent at 11/05/2019  3:44 PM EDT ----- Regarding: elevated LDH Was this patient told to make a follow up to discuss the abnormal labs? If not, she should. ----- Message ----- From: Ronal Fear Sent: 11/02/2019   8:46 AM EDT To: Glean Hess, MD

## 2019-11-06 NOTE — Telephone Encounter (Signed)
Called pt and left VM asking her to call the office back and schedule a follow up with Dr Army Melia with labs to discuss her elevated LDH level from her GYN lab results.  CM

## 2019-11-07 ENCOUNTER — Encounter: Payer: Self-pay | Admitting: Internal Medicine

## 2019-11-07 ENCOUNTER — Ambulatory Visit: Payer: BC Managed Care – PPO | Admitting: Internal Medicine

## 2019-11-07 ENCOUNTER — Other Ambulatory Visit: Payer: Self-pay

## 2019-11-07 VITALS — BP 134/82 | HR 96 | Temp 98.1°F | Ht 62.0 in | Wt 256.0 lb

## 2019-11-07 DIAGNOSIS — R7989 Other specified abnormal findings of blood chemistry: Secondary | ICD-10-CM | POA: Diagnosis not present

## 2019-11-07 NOTE — Progress Notes (Signed)
Date:  11/07/2019   Name:  Marilyn Robertson   DOB:  Aug 10, 1970   MRN:  [id number]   Chief Complaint: LDH Elevated (Follow up from GYN with Lab recheck. )  Labs done at GYN several months ago - elevated LDH and AP.  Per patient, they were repeated 6 weeks later and were still high.  She was taking daily nsaids for fibromyalgia pains for months preceding the lab draw.  She denies anemia, bleeding, muscle injury or trauma.  Her medications are unchanged.  She does have chronic fatigue/fibromyalgia sx which are essentially stable.  Lab Results  Component Value Date   CREATININE 0.7 10/26/2019   BUN 11 10/26/2019   NA 142 06/08/2017   K 4.1 06/08/2017   CL 109 06/08/2017   CO2 26 06/08/2017   Lab Results  Component Value Date   CHOL 192 10/26/2019   HDL 45 10/26/2019   LDLCALC 125 10/26/2019   TRIG 122 10/26/2019   No results found for: TSH No results found for: HGBA1C Lab Results  Component Value Date   WBC 8.9 06/08/2017   HGB 13.1 06/08/2017   HCT 38.4 06/08/2017   MCV 89.6 06/08/2017   PLT 353 06/08/2017   Lab Results  Component Value Date   ALT 19 10/26/2019   AST 16 10/26/2019   ALKPHOS 122 10/26/2019   BILITOT 0.4 06/08/2017     Review of Systems  Constitutional: Positive for fatigue. Negative for chills and unexpected weight change.  Respiratory: Negative for chest tightness and shortness of breath.   Cardiovascular: Positive for leg swelling (intermittent). Negative for chest pain and palpitations.  Gastrointestinal: Negative for blood in stool.  Genitourinary: Negative for hematuria.  Musculoskeletal: Positive for arthralgias and myalgias.  Neurological: Positive for headaches. Negative for dizziness and syncope.    Patient Active Problem List   Diagnosis Date Noted  . Pre-diabetes 07/07/2015  . Clinical depression 06/29/2015  . Headache, migraine 06/29/2015  . Acid reflux 06/29/2015  . Carpal tunnel syndrome 06/29/2015  . Hypothyroidism 06/29/2015   . Paroxysmal digital cyanosis 06/29/2015  . OSA (obstructive sleep apnea) 06/29/2015  . Hyperlipidemia, mild 06/29/2015  . History of endometrial cancer 12/27/2014    Allergies  Allergen Reactions  . Bee Venom Shortness Of Breath and Swelling  . Topiramate Swelling, Anxiety and Rash    "severe watery eyes"; "swelling and reddening of face"     Past Surgical History:  Procedure Laterality Date  . ABDOMINAL HYSTERECTOMY    . BREAST REDUCTION SURGERY  1996  . BREAST SURGERY    . COLONOSCOPY WITH PROPOFOL N/A 08/24/2017   Procedure: COLONOSCOPY WITH PROPOFOL;  Surgeon: Toledo, Benay Pike, MD;  Location: [facility] ENDOSCOPY;  Service: Gastroenterology;  Laterality: N/A;  . ROBOTIC ASSISTED TOTAL HYSTERECTOMY  2016   atypical endometrial hyperplasia    Social History   Tobacco Use  . Smoking status: Never Smoker  . Smokeless tobacco: Never Used  Substance Use Topics  . Alcohol use: Yes    Alcohol/week: 2.0 standard drinks    Types: 2 Standard drinks or equivalent per week    Comment: occasional  . Drug use: No     Medication list has been reviewed and updated.  Current Meds  Medication Sig  . butalbital-acetaminophen-caffeine (FIORICET) 50-325-40 MG tablet Take 1 tablet by mouth every 6 (six) hours as needed.   . clonazePAM (KLONOPIN) 1 MG tablet Take 1 mg by mouth daily as needed.  . cyclobenzaprine (FLEXERIL) 5 MG tablet Take  1 tablet (5 mg total) by mouth at bedtime.  . DULoxetine (CYMBALTA) 30 MG capsule Take 90 mg by mouth daily. Reported on 07/07/2015  . levothyroxine (SYNTHROID) 25 MCG tablet Take 1 tablet by mouth daily. Take in addition to armour.  . propranolol ER (INDERAL LA) 60 MG 24 hr capsule Take 1 capsule by mouth daily.  . rizatriptan (MAXALT) 10 MG tablet Take 10 mg by mouth as needed for migraine. May repeat in 2 hours if needed  . SUMAtriptan (IMITREX) 100 MG tablet Take 100 mg by mouth daily as needed.   . thyroid (NATURE-THROID) 130 MG tablet Take 130 mg by  mouth daily. NP- Thyroid    PHQ 2/9 Scores 11/07/2019 08/28/2018 08/14/2018  PHQ - 2 Score 6 0 1  PHQ- 9 Score 17 - -    BP Readings from Last 3 Encounters:  11/07/19 134/82  08/28/18 112/68  08/14/18 118/78    Physical Exam Vitals and nursing note reviewed.  Constitutional:      General: She is not in acute distress.    Appearance: She is well-developed. She is obese.  HENT:     Head: Normocephalic and atraumatic.  Cardiovascular:     Rate and Rhythm: Normal rate and regular rhythm.     Heart sounds: No murmur.  Pulmonary:     Effort: Pulmonary effort is normal. No respiratory distress.     Breath sounds: No wheezing or rhonchi.  Abdominal:     General: Bowel sounds are normal.     Tenderness: There is abdominal tenderness in the epigastric area. There is no guarding or rebound.  Musculoskeletal:        General: Normal range of motion.     Cervical back: Normal range of motion.  Lymphadenopathy:     Cervical: No cervical adenopathy.  Skin:    General: Skin is warm and dry.     Findings: No rash.  Neurological:     Mental Status: She is alert and oriented to person, place, and time.  Psychiatric:        Attention and Perception: Attention normal.        Mood and Affect: Mood normal.     Wt Readings from Last 3 Encounters:  11/07/19 256 lb (116.1 kg)  08/28/18 248 lb (112.5 kg)  08/14/18 248 lb (112.5 kg)    BP 134/82   Pulse 96   Temp 98.1 F (36.7 C) (Oral)   Ht 5\' 2"  (1.575 m)   Wt 256 lb (116.1 kg)   SpO2 96%   BMI 46.82 kg/m   Assessment and Plan: 1. Elevated liver function tests No obvious cause other than her long term use of nsaids Continue to avoid daily doses - recheck labs with CBC and LDH isoenzymes - CBC with Differential/Platelet - LDH Isoenzyme   Partially dictated using Editor, commissioning. Any errors are unintentional.  Halina Maidens, MD [facility]  11/07/2019

## 2019-11-13 LAB — LACTATE DEHYDROGENASE, ISOENZYMES: LDH: 270 IU/L — ABNORMAL HIGH (ref 119–226)

## 2019-11-13 LAB — CBC WITH DIFFERENTIAL/PLATELET
Basophils Absolute: 0 10*3/uL (ref 0.0–0.2)
Basos: 0 %
EOS (ABSOLUTE): 0.2 10*3/uL (ref 0.0–0.4)
Eos: 2 %
Hematocrit: 39.6 % (ref 34.0–46.6)
Hemoglobin: 13.4 g/dL (ref 11.1–15.9)
Immature Grans (Abs): 0 10*3/uL (ref 0.0–0.1)
Immature Granulocytes: 0 %
Lymphocytes Absolute: 1.6 10*3/uL (ref 0.7–3.1)
Lymphs: 22 %
MCH: 30 pg (ref 26.6–33.0)
MCHC: 33.8 g/dL (ref 31.5–35.7)
MCV: 89 fL (ref 79–97)
Monocytes Absolute: 0.6 10*3/uL (ref 0.1–0.9)
Monocytes: 8 %
Neutrophils Absolute: 5.1 10*3/uL (ref 1.4–7.0)
Neutrophils: 68 %
Platelets: 324 10*3/uL (ref 150–450)
RBC: 4.46 x10E6/uL (ref 3.77–5.28)
RDW: 12.3 % (ref 11.7–15.4)
WBC: 7.5 10*3/uL (ref 3.4–10.8)

## 2020-01-07 ENCOUNTER — Ambulatory Visit: Payer: BC Managed Care – PPO | Admitting: Internal Medicine

## 2020-01-07 ENCOUNTER — Encounter: Payer: Self-pay | Admitting: Internal Medicine

## 2020-01-07 ENCOUNTER — Other Ambulatory Visit: Payer: Self-pay

## 2020-01-07 VITALS — BP 128/88 | HR 97 | Temp 99.0°F | Ht 62.0 in | Wt 256.0 lb

## 2020-01-07 DIAGNOSIS — Z23 Encounter for immunization: Secondary | ICD-10-CM | POA: Diagnosis not present

## 2020-01-07 DIAGNOSIS — L03211 Cellulitis of face: Secondary | ICD-10-CM

## 2020-01-07 MED ORDER — AMOXICILLIN-POT CLAVULANATE 875-125 MG PO TABS: 1.0000 | ORAL_TABLET | Freq: Two times a day (BID) | ORAL | 0 refills | Status: AC

## 2020-01-07 NOTE — Progress Notes (Signed)
Date:  01/07/2020   Name:  Marilyn Robertson   DOB:  [birth year]   MRN:  [id number]   Chief Complaint: Insect Bite (Wednesday. Unsure what bit her. Itching, redder and larger than before. TDAP. )  Insect bite? - she woke up last week with a red lesion on her left forehead. She is not aware of any insect bite or other injury.  She has a new kitten but she is not aware of any scratch or nail puncture in that area.  The area is getting larger and more tender and yesterday noticed swelling in the front of her left ear.  Lab Results  Component Value Date   CREATININE 0.7 10/26/2019   BUN 11 10/26/2019   NA 142 06/08/2017   K 4.1 06/08/2017   CL 109 06/08/2017   CO2 26 06/08/2017   Lab Results  Component Value Date   CHOL 192 10/26/2019   HDL 45 10/26/2019   LDLCALC 125 10/26/2019   TRIG 122 10/26/2019   No results found for: TSH No results found for: HGBA1C Lab Results  Component Value Date   WBC 7.5 11/07/2019   HGB 13.4 11/07/2019   HCT 39.6 11/07/2019   MCV 89 11/07/2019   PLT 324 11/07/2019   Lab Results  Component Value Date   ALT 19 10/26/2019   AST 16 10/26/2019   ALKPHOS 122 10/26/2019   BILITOT 0.4 06/08/2017    Review of Systems  Constitutional: Negative for chills, fatigue and fever.  HENT: Negative for trouble swallowing.   Respiratory: Negative for chest tightness and shortness of breath.   Cardiovascular: Negative for chest pain.  Skin: Positive for color change.  Neurological: Negative for dizziness, light-headedness and headaches.  Hematological: Positive for adenopathy.  Psychiatric/Behavioral: Negative for sleep disturbance.    Patient Active Problem List   Diagnosis Date Noted  . Pre-diabetes 07/07/2015  . Clinical depression 06/29/2015  . Headache, migraine 06/29/2015  . Acid reflux 06/29/2015  . Carpal tunnel syndrome 06/29/2015  . Hypothyroidism 06/29/2015  . Paroxysmal digital cyanosis 06/29/2015  . OSA (obstructive sleep apnea)  06/29/2015  . Hyperlipidemia, mild 06/29/2015  . History of endometrial cancer 12/27/2014    Allergies  Allergen Reactions  . Bee Venom Shortness Of Breath and Swelling  . Topiramate Swelling, Anxiety and Rash    "severe watery eyes"; "swelling and reddening of face"     Past Surgical History:  Procedure Laterality Date  . ABDOMINAL HYSTERECTOMY    . BREAST REDUCTION SURGERY  1996  . BREAST SURGERY    . COLONOSCOPY WITH PROPOFOL N/A 08/24/2017   Procedure: COLONOSCOPY WITH PROPOFOL;  Surgeon: Toledo, Benay Pike, MD;  Location: [facility] ENDOSCOPY;  Service: Gastroenterology;  Laterality: N/A;  . ROBOTIC ASSISTED TOTAL HYSTERECTOMY  2016   atypical endometrial hyperplasia    Social History   Tobacco Use  . Smoking status: Never Smoker  . Smokeless tobacco: Never Used  Vaping Use  . Vaping Use: Never used  Substance Use Topics  . Alcohol use: Yes    Alcohol/week: 2.0 standard drinks    Types: 2 Standard drinks or equivalent per week    Comment: occasional  . Drug use: No     Medication list has been reviewed and updated.  Current Meds  Medication Sig  . butalbital-acetaminophen-caffeine (FIORICET) 50-325-40 MG tablet Take 1 tablet by mouth every 6 (six) hours as needed.   . clonazePAM (KLONOPIN) 1 MG tablet Take 1 mg by mouth daily as needed.  Marland Kitchen  cyclobenzaprine (FLEXERIL) 5 MG tablet Take 1 tablet (5 mg total) by mouth at bedtime.  . DULoxetine (CYMBALTA) 30 MG capsule Take 90 mg by mouth daily. Reported on 07/07/2015  . levothyroxine (SYNTHROID) 25 MCG tablet Take 1 tablet by mouth daily. Take in addition to armour.  . propranolol ER (INDERAL LA) 60 MG 24 hr capsule Take 1 capsule by mouth daily.  . rizatriptan (MAXALT) 10 MG tablet Take 10 mg by mouth as needed for migraine. May repeat in 2 hours if needed  . SUMAtriptan (IMITREX) 100 MG tablet Take 100 mg by mouth daily as needed.   . thyroid (NATURE-THROID) 130 MG tablet Take 130 mg by mouth daily. NP- Thyroid    PHQ  2/9 Scores 01/07/2020 11/07/2019 08/28/2018 08/14/2018  PHQ - 2 Score 0 6 0 1  PHQ- 9 Score 0 17 - -    GAD 7 : Generalized Anxiety Score 01/07/2020 11/07/2019  Nervous, Anxious, on Edge 0 1  Control/stop worrying 0 1  Worry too much - different things 0 1  Trouble relaxing 0 3  Restless 0 3  Easily annoyed or irritable 0 0  Afraid - awful might happen 0 0  Total GAD 7 Score 0 9  Anxiety Difficulty Not difficult at all Not difficult at all    BP Readings from Last 3 Encounters:  01/07/20 128/88  11/07/19 134/82  08/28/18 112/68    Physical Exam Vitals and nursing note reviewed.  Constitutional:      General: She is not in acute distress.    Appearance: She is well-developed.  HENT:     Head: Normocephalic and atraumatic.     Right Ear: Tympanic membrane and ear canal normal.     Left Ear: Tympanic membrane and ear canal normal.  Cardiovascular:     Rate and Rhythm: Normal rate and regular rhythm.  Pulmonary:     Effort: Pulmonary effort is normal. No respiratory distress.     Breath sounds: Normal breath sounds.  Musculoskeletal:        General: Normal range of motion.  Lymphadenopathy:     Head:     Left side of head: Preauricular (1 cm mobile, tender node) adenopathy present.     Cervical: No cervical adenopathy.  Skin:    General: Skin is warm and dry.     Findings: Lesion present. No rash.     Comments: 1 x 0.5 inch red, slightly raised lesion of left forehead near the hairline.    Neurological:     General: No focal deficit present.     Mental Status: She is alert and oriented to person, place, and time.  Psychiatric:        Mood and Affect: Mood normal.        Behavior: Behavior normal.     Wt Readings from Last 3 Encounters:  01/07/20 256 lb (116.1 kg)  11/07/19 256 lb (116.1 kg)  08/28/18 248 lb (112.5 kg)    BP 128/88   Pulse 97   Temp 99 F (37.2 C) (Oral)   Ht 5\' 2"  (1.575 m)   Wt 256 lb (116.1 kg)   SpO2 96%   BMI 46.82 kg/m   Assessment  and Plan: 1. Cellulitis of face Uncertain etiology but will cover for most common pathogens Doubt cat scratch due to no known exposure - amoxicillin-clavulanate (AUGMENTIN) 875-125 MG tablet; Take 1 tablet by mouth 2 (two) times daily for 10 days.  Dispense: 20 tablet; Refill: 0  2.  Need for diphtheria-tetanus-pertussis (Tdap) vaccine - Tdap vaccine greater than or equal to 7yo IM   Partially dictated using Editor, commissioning. Any errors are unintentional.  Halina Maidens, MD [facility]  01/07/2020

## 2020-01-08 ENCOUNTER — Encounter: Payer: Self-pay | Admitting: Emergency Medicine

## 2020-01-08 ENCOUNTER — Ambulatory Visit (INDEPENDENT_AMBULATORY_CARE_PROVIDER_SITE_OTHER): Payer: BC Managed Care – PPO

## 2020-01-08 ENCOUNTER — Other Ambulatory Visit: Payer: Self-pay

## 2020-01-08 ENCOUNTER — Ambulatory Visit
Admission: EM | Admit: 2020-01-08 | Discharge: 2020-01-08 | Disposition: A | Discharge status: Home / Self Care | Payer: BC Managed Care – PPO | Attending: Emergency Medicine | Admitting: Emergency Medicine

## 2020-01-08 DIAGNOSIS — T1490XA Injury, unspecified, initial encounter: Secondary | ICD-10-CM

## 2020-01-08 DIAGNOSIS — S90851A Superficial foreign body, right foot, initial encounter: Secondary | ICD-10-CM

## 2020-01-08 MED ORDER — IBUPROFEN 600 MG PO TABS: 600.0000 mg | ORAL_TABLET | Freq: Four times a day (QID) | ORAL | 0 refills | Status: AC | PRN

## 2020-01-08 NOTE — ED Triage Notes (Signed)
Patient c/o crochet needle stuck in her right heel.

## 2020-01-08 NOTE — Discharge Instructions (Addendum)
Take 600 mg of ibuprofen combined with 1000 mg Tylenol 3-4 times a day as needed for pain.  Keep this clean and dry for the next 24 hours.  Finish the Augmentin.  If you feel better soon.  It was nice meeting you.  Take care.

## 2020-01-08 NOTE — ED Provider Notes (Signed)
HPI  SUBJECTIVE:  Marilyn Robertson is a 49 y.o. female who presents with a crochet needle stuck in her right heel.  States that it was on the ground propped against a hard object, and she slipped backwards, jamming it into her heel.  This occurred immediately prior to arrival.  She reports mild stabbing dull pain.  No bleeding, numbness tingling or foot.  She had a tetanus yesterday.  Symptoms are worse with moving the needle, no alleviating factors.  She has not tried anything for this.  She has no past medical history of diabetes, peripheral neuropathy.  She is currently on day #1 of Augmentin for an insect bite.  PMD: Glean Hess, MD   Past Medical History:  Diagnosis Date  . Cancer ([place])   . Headache   . Sleep apnea   . Thyroid disorder     Past Surgical History:  Procedure Laterality Date  . ABDOMINAL HYSTERECTOMY    . BREAST REDUCTION SURGERY  1996  . BREAST SURGERY    . COLONOSCOPY WITH PROPOFOL N/A 08/24/2017   Procedure: COLONOSCOPY WITH PROPOFOL;  Surgeon: Toledo, Benay Pike, MD;  Location: [facility] ENDOSCOPY;  Service: Gastroenterology;  Laterality: N/A;  . ROBOTIC ASSISTED TOTAL HYSTERECTOMY  2016   atypical endometrial hyperplasia    Family History  Problem Relation Age of Onset  . Depression Mother   . Prostate cancer Father   . Cervical cancer Sister     Social History   Tobacco Use  . Smoking status: Never Smoker  . Smokeless tobacco: Never Used  Vaping Use  . Vaping Use: Never used  Substance Use Topics  . Alcohol use: Yes    Alcohol/week: 2.0 standard drinks    Types: 2 Standard drinks or equivalent per week    Comment: occasional  . Drug use: No    No current facility-administered medications for this encounter.  Current Outpatient Medications:  .  amoxicillin-clavulanate (AUGMENTIN) 875-125 MG tablet, Take 1 tablet by mouth 2 (two) times daily for 10 days., Disp: 20 tablet, Rfl: 0 .  butalbital-acetaminophen-caffeine (FIORICET) 50-325-40 MG  tablet, Take 1 tablet by mouth every 6 (six) hours as needed. , Disp: , Rfl:  .  clonazePAM (KLONOPIN) 1 MG tablet, Take 1 mg by mouth daily as needed., Disp: , Rfl:  .  DULoxetine (CYMBALTA) 30 MG capsule, Take 90 mg by mouth daily. Reported on 07/07/2015, Disp: , Rfl:  .  levothyroxine (SYNTHROID) 25 MCG tablet, Take 1 tablet by mouth daily. Take in addition to armour., Disp: , Rfl:  .  propranolol ER (INDERAL LA) 60 MG 24 hr capsule, Take 1 capsule by mouth daily., Disp: , Rfl:  .  rizatriptan (MAXALT) 10 MG tablet, Take 10 mg by mouth as needed for migraine. May repeat in 2 hours if needed, Disp: , Rfl:  .  SUMAtriptan (IMITREX) 100 MG tablet, Take 100 mg by mouth daily as needed. , Disp: , Rfl:  .  thyroid (NATURE-THROID) 130 MG tablet, Take 130 mg by mouth daily. NP- Thyroid, Disp: , Rfl:  .  cyclobenzaprine (FLEXERIL) 5 MG tablet, Take 1 tablet (5 mg total) by mouth at bedtime., Disp: 30 tablet, Rfl: 0 .  ibuprofen (ADVIL) 600 MG tablet, Take 1 tablet (600 mg total) by mouth every 6 (six) hours as needed., Disp: 30 tablet, Rfl: 0  Allergies  Allergen Reactions  . Bee Venom Shortness Of Breath and Swelling  . Topiramate Swelling, Anxiety and Rash    "severe watery eyes"; "  swelling and reddening of face"      ROS  As noted in HPI.   Physical Exam  BP (!) 173/103 (BP Location: Right Arm)   Pulse 78   Temp 98.2 F (36.8 C) (Oral)   Resp 18   Ht 5\' 2"  (1.575 m)   Wt 116.1 kg   SpO2 98%   BMI 46.82 kg/m   Constitutional: Well developed, well nourished, no acute distress Eyes:  EOMI, conjunctiva normal bilaterally HENT: Normocephalic, atraumatic,mucus membranes moist Respiratory: Normal inspiratory effort Cardiovascular: Normal rate GI: nondistended skin: No rash, skin intact Musculoskeletal: Crochet needle impaled in right heel.  See pictures        Neurologic: Alert & oriented x 3, no focal neuro deficits Psychiatric: Speech and behavior appropriate   ED  Course   Medications - No data to display  Orders Placed This Encounter  Procedures  . DG Foot Complete Right    Standing Status:   Standing    Number of Occurrences:   1    Order Specific Question:   Symptom/Reason for Exam    Answer:   Injury [[id number]]    Order Specific Question:   Radiology Contrast Protocol - do NOT remove file path    Answer:   [email or web address]  . DG Foot Complete Right    Standing Status:   Standing    Number of Occurrences:   1    Order Specific Question:   Reason for Exam (SYMPTOM  OR DIAGNOSIS REQUIRED)    Answer:   post crochet needle removal    No results found for this or any previous visit (from the past 24 hour(s)). DG Foot Complete Right  Result Date: 01/08/2020 CLINICAL DATA:  Status post foreign body removal. EXAM: RIGHT FOOT COMPLETE - 3+ VIEW COMPARISON:  7:56 p.m. FINDINGS: Previously noted metallic probe like foreign body within the soft tissues posterior to the calcaneus is no longer visualized. No residual radiopaque foreign body. No other changes are seen. IMPRESSION: No retained radiopaque foreign body following removal. Electronically Signed   By: Fidela Salisbury MD   On: 01/08/2020 21:12   DG Foot Complete Right  Result Date: 01/08/2020 CLINICAL DATA:  49 year old female stepped on needle. EXAM: RIGHT FOOT COMPLETE - 3+ VIEW COMPARISON:  None. FINDINGS: A needle is noted penetrating into the soft tissues posterior to the calcaneus approximately 13 mm deep to the skin surface. The needle appears intact. The tip of the needle is approximately 5 mm away from the calcaneus. There is no acute fracture or dislocation. Mild osteopenia. No significant arthritic changes. There is a 5 mm plantar calcaneal spur. IMPRESSION: Needle penetrating into the soft tissues posterior to the calcaneus. No acute fracture or dislocation. Electronically Signed   By: Anner Crete M.D.   On: 01/08/2020 20:26    ED Clinical  Impression  1. Foreign body in heel, right, initial encounter   2. Injury      ED Assessment/Plan  Reviewed imaging independently.  Needle penetrating to the soft tissues posterior to the calcaneus.  Approximately 13 mm deep to the skin surface.  Tip of the needle 5 mm away from the calcaneus.  See radiology report for full details.  Discussed case with Dr. Vickki Muff, podiatry on-call.  Will attempt removal here in the urgent care and if not successful, will send to the ED.   Procedure note: Cleaned heel with alcohol.  Using 2 cc of 1% lidocaine with epinephrine, did a regional field block with adequate  anesthesia.  Was able to remove needle in its entirety.  Band-Aid placed.  Patient tolerated procedure well.  Post removal films obtained.  No foreign body seen on repeat films.  Notified Dr. Vickki Muff for successful removal.  He will see her as needed.  Washed foot with chlorhexidine/tap water.  Irrigated wound out with 250 cc of sterile saline.  Bacitracin, dressing placed.  She is already on Augmentin for a skin infection, we will have her continue this.  She got her tetanus yesterday.  Will send home with Tylenol/ibuprofen.  Follow-up with Dr. Vickki Muff as needed.  To the ER for systemic symptoms.  Discussed imaging, MDM, treatment plan, and plan for follow-up with patient. Discussed sn/sx that should prompt return to the ED. patient agrees with plan.   Meds ordered this encounter  Medications  . ibuprofen (ADVIL) 600 MG tablet    Sig: Take 1 tablet (600 mg total) by mouth every 6 (six) hours as needed.    Dispense:  30 tablet    Refill:  0    *This clinic note was created using L[employer]. Therefore, there may be occasional mistakes despite careful proofreading.   ?    Melynda Ripple, MD 01/08/20 [id number]

## 2020-07-22 LAB — HIV ANTIBODY (ROUTINE TESTING W REFLEX): HIV 1&2 Ab, 4th Generation: NEGATIVE

## 2020-07-22 LAB — HM PAP SMEAR: HM Pap smear: NORMAL

## 2020-07-22 LAB — HM HEPATITIS C SCREENING LAB: HM Hepatitis Screen: NEGATIVE

## 2020-07-23 ENCOUNTER — Ambulatory Visit: Payer: BC Managed Care – PPO | Attending: Neurology

## 2020-07-23 DIAGNOSIS — G4733 Obstructive sleep apnea (adult) (pediatric): Secondary | ICD-10-CM | POA: Insufficient documentation

## 2020-07-24 ENCOUNTER — Other Ambulatory Visit: Payer: Self-pay

## 2020-07-30 DIAGNOSIS — Z90710 Acquired absence of both cervix and uterus: Secondary | ICD-10-CM | POA: Insufficient documentation

## 2020-09-14 DIAGNOSIS — G43C0 Periodic headache syndromes in child or adult, not intractable: Secondary | ICD-10-CM | POA: Insufficient documentation

## 2020-09-14 NOTE — Progress Notes (Signed)
Date:  09/15/2020   Name:  Marilyn Robertson   DOB:  04/01/1971   MRN:  [id number]   Chief Complaint: Annual Exam (GYN physical is today so no breast exam needed.)  Marilyn Robertson is a 50 y.o. female who presents today for her Complete Annual Exam. She feels well. She reports exercising - exercising and walking 2 times weekly. She reports she is sleeping fairly well. Breast complaints - none.  Currently going through a divorce.  Mammogram: 08/2019 [facility] ordered by GYN DEXA: none Pap smear: 07/2020 neg thin prep Colonoscopy: 08/2017  Immunization History  Administered Date(s) Administered  . Influenza-Unspecified 03/22/2015, 03/21/2020  . PFIZER(Purple Top)SARS-COV-2 Vaccination 09/24/2019, 10/18/2019, 02/07/2020  . Tdap 01/07/2020    Migraine  This is a chronic problem. The problem has been unchanged. Pertinent negatives include no abdominal pain, coughing, dizziness, fever, hearing loss, tinnitus or vomiting. She has tried triptans (fioricet) for the symptoms.  Depression        This is a chronic problem.The problem is unchanged.  Associated symptoms include fatigue and headaches.  Past treatments include SNRIs - Serotonin and norepinephrine reuptake inhibitors and other medications.  Compliance with treatment is good.  Past medical history includes thyroid problem.   Thyroid Problem Presents for follow-up visit. Symptoms include fatigue. Patient reports no anxiety, constipation, diarrhea, palpitations or tremors. The symptoms have been stable (followed by endocrinology).  Diabetes She presents for her follow-up diabetic visit. Diabetes type: prediabetes. Hypoglycemia symptoms include headaches. Pertinent negatives for hypoglycemia include no dizziness, nervousness/anxiousness or tremors. Associated symptoms include fatigue. Pertinent negatives for diabetes include no chest pain, no polydipsia and no polyuria. Symptoms are stable.  OSA - on CPAP 17 cm H2O.  Repeat sleep study last  year AHI 61 with no REM sleep achieved.  She is compliant with the machine at least 6 hours per night.  She is not sure the mask fits well and her machine is at least 52 years old. Jaw pain - started with tooth grinding last fall during initial stages of divorce.  She got a mouth guard but it did not fit well - finally got it adjusted.  Taking Advil intermittently only.  Still having left sided jaw discomfort.   Lab Results  Component Value Date   CREATININE 0.7 10/26/2019   BUN 11 10/26/2019   NA 142 06/08/2017   K 4.1 06/08/2017   CL 109 06/08/2017   CO2 26 06/08/2017   Lab Results  Component Value Date   CHOL 192 10/26/2019   HDL 45 10/26/2019   LDLCALC 125 10/26/2019   TRIG 122 10/26/2019   No results found for: TSH No results found for: HGBA1C Lab Results  Component Value Date   WBC 7.5 11/07/2019   HGB 13.4 11/07/2019   HCT 39.6 11/07/2019   MCV 89 11/07/2019   PLT 324 11/07/2019   Lab Results  Component Value Date   ALT 19 10/26/2019   AST 16 10/26/2019   ALKPHOS 122 10/26/2019   BILITOT 0.4 06/08/2017     Review of Systems  Constitutional: Positive for fatigue. Negative for chills, fever and unexpected weight change.  HENT: Negative for congestion, hearing loss, tinnitus, trouble swallowing and voice change.   Eyes: Negative for visual disturbance.  Respiratory: Negative for cough, chest tightness, shortness of breath and wheezing.   Cardiovascular: Negative for chest pain, palpitations and leg swelling.  Gastrointestinal: Negative for abdominal pain, constipation, diarrhea and vomiting.  Endocrine: Negative for polydipsia and  polyuria.  Genitourinary: Negative for dysuria, frequency, genital sores, vaginal bleeding and vaginal discharge.  Musculoskeletal: Positive for arthralgias (left jaw pain) and joint swelling. Negative for gait problem.  Skin: Negative for color change and rash.  Neurological: Positive for headaches. Negative for dizziness, tremors and  light-headedness.  Hematological: Negative for adenopathy. Does not bruise/bleed easily.  Psychiatric/Behavioral: Positive for depression and sleep disturbance (OSA). Negative for dysphoric mood. The patient is not nervous/anxious.     Patient Active Problem List   Diagnosis Date Noted  . 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 Status post hysterectomy 07/30/2020  . Pre-diabetes 07/07/2015  . Major depression single episode, in partial remission ([place]) 06/29/2015  . Acid reflux 06/29/2015  . Carpal tunnel syndrome 06/29/2015  . Hypothyroidism 06/29/2015  . Paroxysmal digital cyanosis 06/29/2015  . OSA (obstructive sleep apnea) 06/29/2015  . Hyperlipidemia, mild 06/29/2015  . History of endometrial cancer 12/27/2014    Allergies  Allergen Reactions  . Bee Venom Shortness Of Breath and Swelling  . Topiramate Swelling, Anxiety and Rash    "severe watery eyes"; "swelling and reddening of face"     Past Surgical History:  Procedure Laterality Date  . ABDOMINAL HYSTERECTOMY    . BREAST REDUCTION SURGERY  1996  . BREAST SURGERY    . COLONOSCOPY WITH PROPOFOL N/A 08/24/2017   Procedure: COLONOSCOPY WITH PROPOFOL;  Surgeon: Toledo, Benay Pike, MD;  Location: [facility] ENDOSCOPY;  Service: Gastroenterology;  Laterality: N/A;  . ROBOTIC ASSISTED TOTAL HYSTERECTOMY  2016   atypical endometrial hyperplasia    Social History   Tobacco Use  . Smoking status: Never Smoker  . Smokeless tobacco: Never Used  Vaping Use  . Vaping Use: Never used  Substance Use Topics  . Alcohol use: Yes    Alcohol/week: 2.0 standard drinks    Types: 2 Standard drinks or equivalent per week    Comment: occasional  . Drug use: No     Medication list has been reviewed and updated.  Current Meds  Medication Sig  . butalbital-acetaminophen-caffeine (FIORICET) 50-325-40 MG tablet Take 1 tablet by mouth every 6 (six) hours as needed.   . calcium-vitamin D (OSCAL  WITH D) 500-200 MG-UNIT tablet Take 1 tablet by mouth.  . cetirizine (ZYRTEC) 10 MG chewable tablet Chew 10 mg by mouth daily.  Marland Kitchen doxepin (SINEQUAN) 10 MG capsule Take 10-20 mg by mouth at bedtime as needed.  . DULoxetine (CYMBALTA) 60 MG capsule Take 120 mg by mouth daily.  Marland Kitchen ibuprofen (ADVIL) 600 MG tablet Take 1 tablet (600 mg total) by mouth every 6 (six) hours as needed.  Marland Kitchen liothyronine (CYTOMEL) 5 MCG tablet Take 5 mcg by mouth daily.  . nortriptyline (PAMELOR) 10 MG capsule Take 20 mg by mouth at bedtime.  . NP THYROID 90 MG tablet Take 135 mg by mouth daily.  . propranolol ER (INDERAL LA) 60 MG 24 hr capsule Take 1 capsule by mouth daily.  . rizatriptan (MAXALT) 10 MG tablet Take 10 mg by mouth as needed for migraine. May repeat in 2 hours if needed  . Selenium 200 MCG CAPS Take 1 capsule by mouth daily.  . SUMAtriptan (IMITREX) 100 MG tablet Take 100 mg by mouth daily as needed.   Marland Kitchen tiZANidine (ZANAFLEX) 4 MG tablet Take 4 mg by mouth every 6 (six) hours as needed.  . vitamin B-12 (CYANOCOBALAMIN) 500 MCG tablet Take 500 mcg by mouth daily.  Marland Kitchen zinc gluconate 50 MG tablet Take 50 mg  by mouth daily.    PHQ 2/9 Scores 09/15/2020 01/07/2020 11/07/2019 08/28/2018  PHQ - 2 Score 6 0 6 0  PHQ- 9 Score 13 0 17 -    GAD 7 : Generalized Anxiety Score 09/15/2020 01/07/2020 11/07/2019  Nervous, Anxious, on Edge 3 0 1  Control/stop worrying 3 0 1  Worry too much - different things 3 0 1  Trouble relaxing 1 0 3  Restless 1 0 3  Easily annoyed or irritable 0 0 0  Afraid - awful might happen 0 0 0  Total GAD 7 Score 11 0 9  Anxiety Difficulty Not difficult at all Not difficult at all Not difficult at all    BP Readings from Last 3 Encounters:  09/15/20 110/80  01/08/20 (!) 173/103  01/07/20 128/88    Physical Exam Vitals and nursing note reviewed.  Constitutional:      General: She is not in acute distress.    Appearance: She is well-developed.  HENT:     Head: Normocephalic and  atraumatic.     Jaw: Tenderness and pain on movement present. No swelling or malocclusion.     Right Ear: Tympanic membrane and ear canal normal.     Left Ear: Tympanic membrane and ear canal normal.     Nose:     Right Sinus: No maxillary sinus tenderness.     Left Sinus: No maxillary sinus tenderness.  Eyes:     General: No scleral icterus.       Right eye: No discharge.        Left eye: No discharge.     Conjunctiva/sclera: Conjunctivae normal.  Neck:     Thyroid: No thyromegaly.     Vascular: No carotid bruit.  Cardiovascular:     Rate and Rhythm: Normal rate and regular rhythm.     Pulses: Normal pulses.     Heart sounds: Normal heart sounds.  Pulmonary:     Effort: Pulmonary effort is normal. No respiratory distress.     Breath sounds: No wheezing.  Abdominal:     General: Bowel sounds are normal.     Palpations: Abdomen is soft.     Tenderness: There is no abdominal tenderness.  Musculoskeletal:     Cervical back: Normal range of motion. No erythema.     Right lower leg: No edema.     Left lower leg: No edema.  Lymphadenopathy:     Cervical: No cervical adenopathy.  Skin:    General: Skin is warm and dry.     Findings: No rash.  Neurological:     Mental Status: She is alert and oriented to person, place, and time.     Cranial Nerves: No cranial nerve deficit.     Sensory: No sensory deficit.     Deep Tendon Reflexes: Reflexes are normal and symmetric.  Psychiatric:        Attention and Perception: Attention normal.        Mood and Affect: Mood normal.     Wt Readings from Last 3 Encounters:  09/15/20 242 lb (109.8 kg)  01/08/20 256 lb (116.1 kg)  01/07/20 256 lb (116.1 kg)    BP 110/80   Pulse 97   Ht 5\' 2"  (1.575 m)   Wt 242 lb (109.8 kg)   SpO2 97%   BMI 44.26 kg/m   Assessment and Plan: 1. Annual physical exam Exam is normal except for weight. Encourage regular exercise and appropriate dietary changes. - CBC with Differential/Platelet  2.  Encounter for screening mammogram for breast cancer To be ordered by GYN  3. Pre-diabetes Continue to work on diet/weight loss - Comprehensive metabolic panel - Hemoglobin A1c  4. Hyperlipidemia, mild Will check labs and advise - Lipid panel  5. Major depression single episode, in partial remission (HCC) Clinically stable on current regimen with good control of symptoms, No SI or HI. Will continue current therapy.  6. 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 OSA (obstructive sleep apnea) Needs new CPAP titration and new machine - Ambulatory referral to Sleep Studies  8. TMJ syndrome Take advil, add flexeril at hs, continue mouth guard - cyclobenzaprine (FLEXERIL) 5 MG tablet; Take 1 tablet (5 mg total) by mouth at bedtime.  Dispense: 30 tablet; Refill: 1   Partially dictated using Editor, commissioning. Any errors are unintentional.  Halina Maidens, MD [facility]  09/15/2020

## 2020-09-15 ENCOUNTER — Ambulatory Visit (INDEPENDENT_AMBULATORY_CARE_PROVIDER_SITE_OTHER): Payer: BC Managed Care – PPO | Admitting: Internal Medicine

## 2020-09-15 ENCOUNTER — Encounter: Payer: Self-pay | Admitting: Internal Medicine

## 2020-09-15 ENCOUNTER — Other Ambulatory Visit: Payer: Self-pay

## 2020-09-15 VITALS — BP 110/80 | HR 97 | Ht 62.0 in | Wt 242.0 lb

## 2020-09-15 DIAGNOSIS — R7303 Prediabetes: Secondary | ICD-10-CM | POA: Diagnosis not present

## 2020-09-15 DIAGNOSIS — F324 Major depressive disorder, single episode, in partial remission: Secondary | ICD-10-CM

## 2020-09-15 DIAGNOSIS — G43C0 Periodic headache syndromes in child or adult, not intractable: Secondary | ICD-10-CM | POA: Diagnosis not present

## 2020-09-15 DIAGNOSIS — M26629 Arthralgia of temporomandibular joint, unspecified side: Secondary | ICD-10-CM

## 2020-09-15 DIAGNOSIS — G4733 Obstructive sleep apnea (adult) (pediatric): Secondary | ICD-10-CM

## 2020-09-15 DIAGNOSIS — Z1231 Encounter for screening mammogram for malignant neoplasm of breast: Secondary | ICD-10-CM | POA: Diagnosis not present

## 2020-09-15 DIAGNOSIS — E785 Hyperlipidemia, unspecified: Secondary | ICD-10-CM | POA: Diagnosis not present

## 2020-09-15 DIAGNOSIS — Z0000 Encounter for general adult medical examination without abnormal findings: Secondary | ICD-10-CM | POA: Diagnosis not present

## 2020-09-15 MED ORDER — CYCLOBENZAPRINE HCL 5 MG PO TABS: 5.0000 mg | ORAL_TABLET | Freq: Every day | ORAL | 1 refills | Status: DC

## 2020-09-15 NOTE — Patient Instructions (Signed)
Check with insurance to see where you have to get the CPAP machine

## 2020-09-16 LAB — COMPREHENSIVE METABOLIC PANEL
ALT: 24 IU/L (ref 0–32)
AST: 17 IU/L (ref 0–40)
Albumin/Globulin Ratio: 1.7 (ref 1.2–2.2)
Albumin: 4.5 g/dL (ref 3.8–4.9)
Alkaline Phosphatase: 145 IU/L — ABNORMAL HIGH (ref 44–121)
BUN/Creatinine Ratio: 11 (ref 9–23)
BUN: 8 mg/dL (ref 6–24)
Bilirubin Total: 0.3 mg/dL (ref 0.0–1.2)
CO2: 22 mmol/L (ref 20–29)
Calcium: 9.4 mg/dL (ref 8.7–10.2)
Chloride: 103 mmol/L (ref 96–106)
Creatinine, Ser: 0.73 mg/dL (ref 0.57–1.00)
Globulin, Total: 2.7 g/dL (ref 1.5–4.5)
Glucose: 95 mg/dL (ref 65–99)
Potassium: 4.9 mmol/L (ref 3.5–5.2)
Sodium: 142 mmol/L (ref 134–144)
Total Protein: 7.2 g/dL (ref 6.0–8.5)
eGFR: 99 mL/min/{1.73_m2} (ref >59)

## 2020-09-16 LAB — CBC WITH DIFFERENTIAL/PLATELET
Basophils Absolute: 0 10*3/uL (ref 0.0–0.2)
Basos: 1 %
EOS (ABSOLUTE): 0.2 10*3/uL (ref 0.0–0.4)
Eos: 2 %
Hematocrit: 39.8 % (ref 34.0–46.6)
Hemoglobin: 13.4 g/dL (ref 11.1–15.9)
Immature Grans (Abs): 0 10*3/uL (ref 0.0–0.1)
Immature Granulocytes: 0 %
Lymphocytes Absolute: 1.8 10*3/uL (ref 0.7–3.1)
Lymphs: 22 %
MCH: 29.6 pg (ref 26.6–33.0)
MCHC: 33.7 g/dL (ref 31.5–35.7)
MCV: 88 fL (ref 79–97)
Monocytes Absolute: 0.5 10*3/uL (ref 0.1–0.9)
Monocytes: 6 %
Neutrophils Absolute: 5.7 10*3/uL (ref 1.4–7.0)
Neutrophils: 69 %
Platelets: 365 10*3/uL (ref 150–450)
RBC: 4.52 x10E6/uL (ref 3.77–5.28)
RDW: 12.5 % (ref 11.7–15.4)
WBC: 8.2 10*3/uL (ref 3.4–10.8)

## 2020-09-16 LAB — LIPID PANEL
Chol/HDL Ratio: 3.8 ratio (ref 0.0–4.4)
Cholesterol, Total: 210 mg/dL — ABNORMAL HIGH (ref 100–199)
HDL: 56 mg/dL (ref >39)
LDL Chol Calc (NIH): 140 mg/dL — ABNORMAL HIGH (ref 0–99)
Triglycerides: 76 mg/dL (ref 0–149)
VLDL Cholesterol Cal: 14 mg/dL (ref 5–40)

## 2020-09-16 LAB — HEMOGLOBIN A1C
Est. average glucose Bld gHb Est-mCnc: 114 mg/dL
Hgb A1c MFr Bld: 5.6 % (ref 4.8–5.6)

## 2020-09-16 NOTE — Progress Notes (Signed)
Called pt left VM to call back.  Will route result note to [facility] Nurse Triage for follow up when patient returns call to clinic. Nurse may give results to patient if they return call. CRM created for this message.   KP

## 2020-10-02 ENCOUNTER — Ambulatory Visit: Payer: BC Managed Care – PPO | Attending: Neurology

## 2020-10-02 DIAGNOSIS — G4733 Obstructive sleep apnea (adult) (pediatric): Secondary | ICD-10-CM | POA: Diagnosis not present

## 2020-10-06 ENCOUNTER — Other Ambulatory Visit: Payer: Self-pay

## 2020-10-10 NOTE — Progress Notes (Signed)
Are you able to help with this?  It looks like sleep study was done on 10/02/20.

## 2020-12-03 ENCOUNTER — Other Ambulatory Visit: Payer: Self-pay | Admitting: Internal Medicine

## 2020-12-03 DIAGNOSIS — M26629 Arthralgia of temporomandibular joint, unspecified side: Secondary | ICD-10-CM

## 2020-12-03 NOTE — Telephone Encounter (Signed)
Requested medications are due for refill today.  yes  Requested medications are on the active medications list.  yes  Last refill. 09/15/2020  Future visit scheduled.   yes  Notes to clinic.  Medication not delegated.

## 2021-08-07 ENCOUNTER — Ambulatory Visit: Payer: BC Managed Care – PPO | Admitting: Internal Medicine

## 2021-08-07 ENCOUNTER — Other Ambulatory Visit: Payer: Self-pay

## 2021-08-07 ENCOUNTER — Encounter: Payer: Self-pay | Admitting: Internal Medicine

## 2021-08-07 VITALS — BP 134/70 | HR 93 | Ht 62.0 in | Wt 250.0 lb

## 2021-08-07 DIAGNOSIS — D649 Anemia, unspecified: Secondary | ICD-10-CM | POA: Diagnosis not present

## 2021-08-07 DIAGNOSIS — G4733 Obstructive sleep apnea (adult) (pediatric): Secondary | ICD-10-CM | POA: Diagnosis not present

## 2021-08-07 NOTE — Progress Notes (Signed)
Date:  08/07/2021   Name:  Marilyn Robertson   DOB:  06-07-1971   MRN:  [id number]   Chief Complaint: Sleep Apnea  Anemia Presents for initial visit. There has been no anorexia, fever, leg swelling, light-headedness, palpitations or weight loss. Signs of blood loss that are not present include hematochezia, melena and menorrhagia. Past treatments include oral vitamin B12 (has not been able to give blood recently due to Hgb ~11.5).   OSA - has a 53 yr old machine on CPAP.  Sleeping okay but not her best.  Had sleep study last year that recommended Bipap 21/16. She never received the equipment from [facility].  Her current machine is breaking down and she wants to get a new one.  Migraines - started Emgality last year and it has been great.  She now uses Maxalt only for breakthrough headaches.  Followed by Neurology.  Lab Results  Component Value Date   NA 142 09/15/2020   K 4.9 09/15/2020   CO2 22 09/15/2020   GLUCOSE 95 09/15/2020   BUN 8 09/15/2020   CREATININE 0.73 09/15/2020   CALCIUM 9.4 09/15/2020   EGFR 99 09/15/2020   GFRNONAA >60 06/08/2017   Lab Results  Component Value Date   CHOL 210 (H) 09/15/2020   HDL 56 09/15/2020   LDLCALC 140 (H) 09/15/2020   TRIG 76 09/15/2020   CHOLHDL 3.8 09/15/2020   No results found for: TSH Lab Results  Component Value Date   HGBA1C 5.6 09/15/2020   Lab Results  Component Value Date   WBC 8.2 09/15/2020   HGB 13.4 09/15/2020   HCT 39.8 09/15/2020   MCV 88 09/15/2020   PLT 365 09/15/2020   Lab Results  Component Value Date   ALT 24 09/15/2020   AST 17 09/15/2020   ALKPHOS 145 (H) 09/15/2020   BILITOT 0.3 09/15/2020   No results found for: 25OHVITD2, 25OHVITD3, VD25OH   Review of Systems  Constitutional:  Positive for fatigue. Negative for chills, fever, unexpected weight change and weight loss.  HENT:  Positive for congestion and postnasal drip. Negative for sore throat.   Respiratory:  Negative for cough, chest  tightness, shortness of breath and wheezing.   Cardiovascular:  Negative for chest pain and palpitations.  Gastrointestinal:  Negative for abdominal distention, anorexia, hematochezia and melena.  Genitourinary:  Negative for menorrhagia.  Neurological:  Positive for headaches. Negative for dizziness and light-headedness.  Psychiatric/Behavioral:  Positive for sleep disturbance. Negative for dysphoric mood. The patient is not nervous/anxious.    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, not intractable 09/14/2020   Status post hysterectomy 07/30/2020   Pre-diabetes 07/07/2015   Major depression single episode, in partial remission ([place]) 06/29/2015   Acid reflux 06/29/2015   Carpal tunnel syndrome 06/29/2015   Hypothyroidism 06/29/2015   Paroxysmal digital cyanosis 06/29/2015   OSA (obstructive sleep apnea) 06/29/2015   Hyperlipidemia, mild 06/29/2015   History of endometrial cancer 12/27/2014    Allergies  Allergen Reactions   Bee Venom Shortness Of Breath and Swelling   Topiramate Swelling, Anxiety and Rash    "severe watery eyes"; "swelling and reddening of face"     Past Surgical History:  Procedure Laterality Date   ABDOMINAL HYSTERECTOMY     BREAST REDUCTION SURGERY  1996   BREAST SURGERY     COLONOSCOPY WITH PROPOFOL N/A 08/24/2017   Procedure: COLONOSCOPY WITH PROPOFOL;  Surgeon: Toledo, Benay Pike, MD;  Location: [facility] ENDOSCOPY;  Service: Gastroenterology;  Laterality: N/A;   ROBOTIC ASSISTED TOTAL HYSTERECTOMY  2016   atypical endometrial hyperplasia    Social History   Tobacco Use   Smoking status: Never   Smokeless tobacco: Never  Vaping Use   Vaping Use: Never used  Substance Use Topics   Alcohol use: Yes    Alcohol/week: 2.0 standard drinks    Types: 2 Standard drinks or equivalent per week    Comment: occasional   Drug use: No     Medication list has been  reviewed and updated.  Current Meds  Medication Sig   butalbital-acetaminophen-caffeine (FIORICET) 50-325-40 MG tablet Take 1 tablet by mouth every 6 (six) hours as needed.    calcium-vitamin D (OSCAL WITH D) 500-200 MG-UNIT tablet Take 1 tablet by mouth.   cetirizine (ZYRTEC) 10 MG chewable tablet Chew 10 mg by mouth daily.   DULoxetine (CYMBALTA) 60 MG capsule Take 120 mg by mouth daily.   ibuprofen (ADVIL) 600 MG tablet Take 1 tablet (600 mg total) by mouth every 6 (six) hours as needed.   liothyronine (CYTOMEL) 5 MCG tablet Take 5 mcg by mouth daily.   NP THYROID 90 MG tablet Take 135 mg by mouth daily.   propranolol ER (INDERAL LA) 60 MG 24 hr capsule Take 1 capsule by mouth daily.   rizatriptan (MAXALT) 10 MG tablet Take 10 mg by mouth as needed for migraine. May repeat in 2 hours if needed   Selenium 200 MCG CAPS Take 1 capsule by mouth daily.   vitamin B-12 (CYANOCOBALAMIN) 500 MCG tablet Take 500 mcg by mouth daily.   zinc gluconate 50 MG tablet Take 50 mg by mouth daily.    PHQ 2/9 Scores 08/07/2021 09/15/2020 01/07/2020 11/07/2019  PHQ - 2 Score 0 6 0 6  PHQ- 9 Score 4 13 0 17    GAD 7 : Generalized Anxiety Score 08/07/2021 09/15/2020 01/07/2020 11/07/2019  Nervous, Anxious, on Edge 0 3 0 1  Control/stop worrying 0 3 0 1  Worry too much - different things 0 3 0 1  Trouble relaxing 1 1 0 3  Restless 0 1 0 3  Easily annoyed or irritable 0 0 0 0  Afraid - awful might happen 0 0 0 0  Total GAD 7 Score 1 11 0 9  Anxiety Difficulty Not difficult at all Not difficult at all Not difficult at all Not difficult at all    BP Readings from Last 3 Encounters:  08/07/21 134/70  09/15/20 110/80  01/08/20 (!) 173/103    Physical Exam Vitals and nursing note reviewed.  Constitutional:      General: She is not in acute distress.    Appearance: Normal appearance. She is well-developed.  HENT:     Head: Normocephalic and atraumatic.  Cardiovascular:     Rate and Rhythm: Normal rate  and regular rhythm.  Pulmonary:     Effort: Pulmonary effort is normal. No respiratory distress.     Breath sounds: No wheezing or rhonchi.  Musculoskeletal:     Cervical back: Normal range of motion.     Right lower leg: No edema.     Left lower leg: No edema.  Lymphadenopathy:     Cervical: No cervical adenopathy.  Skin:    General: Skin is warm and dry.     Findings: No rash.  Neurological:     Mental Status: She is alert and oriented to person, place,  and time.  Psychiatric:        Mood and Affect: Mood normal.        Behavior: Behavior normal.    Wt Readings from Last 3 Encounters:  08/07/21 250 lb (113.4 kg)  09/15/20 242 lb (109.8 kg)  01/08/20 256 lb (116.1 kg)    BP 134/70    Pulse 93    Ht _0  (1.575 m)    Wt 250 lb (113.4 kg)    SpO2 98%    BMI 45.73 kg/m   Assessment and Plan: 1. OSA (obstructive sleep apnea) Overdue for new BiPap machine - she will contact [facility]; given copy of sleep study. Hopefully they can send me the form to approve the equipment that she needs.  2. Anemia, unspecified type Noted by the [employer] Will obtain labs and advise - has been intolerant of iron supplements in the past. Will suggest FusionPlus if needed. - CBC with Differential/Platelet - Iron, TIBC and Ferritin Panel   Partially dictated using Editor, commissioning. Any errors are unintentional.  Halina Maidens, MD [facility]  08/07/2021

## 2021-08-08 LAB — CBC WITH DIFFERENTIAL/PLATELET
Basophils Absolute: 0 10*3/uL (ref 0.0–0.2)
Basos: 1 %
EOS (ABSOLUTE): 0.2 10*3/uL (ref 0.0–0.4)
Eos: 2 %
Hematocrit: 37.5 % (ref 34.0–46.6)
Hemoglobin: 12.1 g/dL (ref 11.1–15.9)
Immature Grans (Abs): 0 10*3/uL (ref 0.0–0.1)
Immature Granulocytes: 0 %
Lymphocytes Absolute: 1.4 10*3/uL (ref 0.7–3.1)
Lymphs: 16 %
MCH: 28.1 pg (ref 26.6–33.0)
MCHC: 32.3 g/dL (ref 31.5–35.7)
MCV: 87 fL (ref 79–97)
Monocytes Absolute: 0.8 10*3/uL (ref 0.1–0.9)
Monocytes: 9 %
Neutrophils Absolute: 6.3 10*3/uL (ref 1.4–7.0)
Neutrophils: 72 %
Platelets: 281 10*3/uL (ref 150–450)
RBC: 4.31 x10E6/uL (ref 3.77–5.28)
RDW: 13.3 % (ref 11.7–15.4)
WBC: 8.7 10*3/uL (ref 3.4–10.8)

## 2021-08-08 LAB — IRON,TIBC AND FERRITIN PANEL
Ferritin: 76 ng/mL (ref 15–150)
Iron Saturation: 19 % (ref 15–55)
Iron: 58 ug/dL (ref 27–159)
Total Iron Binding Capacity: 313 ug/dL (ref 250–450)
UIBC: 255 ug/dL (ref 131–425)

## 2021-08-13 ENCOUNTER — Encounter: Payer: Self-pay | Admitting: Internal Medicine

## 2021-08-18 ENCOUNTER — Other Ambulatory Visit: Payer: Self-pay | Admitting: Internal Medicine

## 2021-08-18 DIAGNOSIS — G4733 Obstructive sleep apnea (adult) (pediatric): Secondary | ICD-10-CM

## 2021-09-16 ENCOUNTER — Encounter: Payer: Self-pay | Admitting: Internal Medicine

## 2021-09-17 ENCOUNTER — Ambulatory Visit (INDEPENDENT_AMBULATORY_CARE_PROVIDER_SITE_OTHER): Payer: BC Managed Care – PPO | Admitting: Internal Medicine

## 2021-09-17 ENCOUNTER — Encounter: Payer: Self-pay | Admitting: Internal Medicine

## 2021-09-17 VITALS — BP 134/88 | HR 85 | Ht 62.0 in | Wt 246.0 lb

## 2021-09-17 DIAGNOSIS — F324 Major depressive disorder, single episode, in partial remission: Secondary | ICD-10-CM

## 2021-09-17 DIAGNOSIS — Z1231 Encounter for screening mammogram for malignant neoplasm of breast: Secondary | ICD-10-CM | POA: Diagnosis not present

## 2021-09-17 DIAGNOSIS — D649 Anemia, unspecified: Secondary | ICD-10-CM | POA: Diagnosis not present

## 2021-09-17 DIAGNOSIS — R7303 Prediabetes: Secondary | ICD-10-CM | POA: Diagnosis not present

## 2021-09-17 DIAGNOSIS — M25511 Pain in right shoulder: Secondary | ICD-10-CM

## 2021-09-17 DIAGNOSIS — Z0000 Encounter for general adult medical examination without abnormal findings: Secondary | ICD-10-CM

## 2021-09-17 DIAGNOSIS — E785 Hyperlipidemia, unspecified: Secondary | ICD-10-CM

## 2021-09-17 DIAGNOSIS — G8929 Other chronic pain: Secondary | ICD-10-CM

## 2021-09-17 DIAGNOSIS — G4733 Obstructive sleep apnea (adult) (pediatric): Secondary | ICD-10-CM

## 2021-09-17 MED ORDER — MELOXICAM 15 MG PO TABS: 15.0000 mg | ORAL_TABLET | Freq: Every day | ORAL | 0 refills | Status: DC

## 2021-09-17 NOTE — Progress Notes (Signed)
? ? ?Date:  09/17/2021  ? ?Name:  Marilyn Robertson   DOB:  05-May-1971   MRN:  [id number] ? ? ?Chief Complaint: Annual Exam (Breast exam no pap ) ?Marilyn Robertson is a 51 y.o. female who presents today for her Complete Annual Exam. She feels well. She reports exercising none. She reports she is sleeping well. Breast complaints none. ? ?Mammogram: 09/2020 ?DEXA: none ?Pap smear: discontinued ?Colonoscopy: 2019 repeat 10 yrs ? ?Health Maintenance Due  ?Topic Date Due  ? Zoster Vaccines- Shingrix (1 of 2) Never done  ? COVID-19 Vaccine (5 - Booster for Pfizer series) 03/17/2021  ?  ?Immunization History  ?Administered Date(s) Administered  ? Influenza-Unspecified 03/22/2015, 03/21/2020, 03/21/2021  ? PFIZER(Purple Top)SARS-COV-2 Vaccination 09/24/2019, 10/18/2019, 02/07/2020, 01/20/2021  ? Tdap 01/07/2020  ? ? ?Hyperlipidemia ?This is a chronic problem. The problem is uncontrolled. Pertinent negatives include no chest pain or shortness of breath.  ?Diabetes ?She presents for her follow-up diabetic visit. Diabetes type: prediabetes. Her disease course has been stable. Pertinent negatives for hypoglycemia include no dizziness, headaches, nervousness/anxiousness or tremors. Pertinent negatives for diabetes include no chest pain, no fatigue, no polydipsia and no polyuria.  ?Shoulder Pain  ?The pain is present in the right shoulder. This is a new problem. The current episode started 1 to 4 weeks ago. There has been no history of extremity trauma. The problem occurs daily. The problem has been unchanged. The quality of the pain is described as aching. The pain is moderate. Associated symptoms include a limited range of motion. Pertinent negatives include no fever. She has tried nothing for the symptoms.  ?OSA - new BiPap machine this week. She has an appt in June to review her usage data.  She is sleeping well but wants to try a different mask. ? ?Lab Results  ?Component Value Date  ? NA 142 09/15/2020  ? K 4.9 09/15/2020  ?  CO2 22 09/15/2020  ? GLUCOSE 95 09/15/2020  ? BUN 8 09/15/2020  ? CREATININE 0.73 09/15/2020  ? CALCIUM 9.4 09/15/2020  ? EGFR 99 09/15/2020  ? GFRNONAA >60 06/08/2017  ? ?Lab Results  ?Component Value Date  ? CHOL 210 (H) 09/15/2020  ? HDL 56 09/15/2020  ? LDLCALC 140 (H) 09/15/2020  ? TRIG 76 09/15/2020  ? CHOLHDL 3.8 09/15/2020  ? ?No results found for: TSH ?Lab Results  ?Component Value Date  ? HGBA1C 5.6 09/15/2020  ? ?Lab Results  ?Component Value Date  ? WBC 8.7 08/07/2021  ? HGB 12.1 08/07/2021  ? HCT 37.5 08/07/2021  ? MCV 87 08/07/2021  ? PLT 281 08/07/2021  ? ?Lab Results  ?Component Value Date  ? ALT 24 09/15/2020  ? AST 17 09/15/2020  ? ALKPHOS 145 (H) 09/15/2020  ? BILITOT 0.3 09/15/2020  ? ?No results found for: 25OHVITD2, [place], VD25OH  ? ?Review of Systems  ?Constitutional:  Negative for chills, fatigue and fever.  ?HENT:  Negative for congestion, hearing loss, tinnitus, trouble swallowing and voice change.   ?Eyes:  Negative for visual disturbance.  ?Respiratory:  Negative for cough, chest tightness, shortness of breath and wheezing.   ?Cardiovascular:  Negative for chest pain, palpitations and leg swelling.  ?Gastrointestinal:  Negative for abdominal pain, constipation, diarrhea and vomiting.  ?Endocrine: Negative for polydipsia and polyuria.  ?Genitourinary:  Negative for dysuria, frequency, genital sores, vaginal bleeding and vaginal discharge.  ?Musculoskeletal:  Positive for arthralgias (shoulder pain on right radiating into axilla). Negative for gait problem and joint swelling.  ?  Skin:  Negative for color change and rash.  ?Neurological:  Negative for dizziness, tremors, light-headedness and headaches.  ?Hematological:  Negative for adenopathy. Does not bruise/bleed easily.  ?Psychiatric/Behavioral:  Negative for dysphoric mood and sleep disturbance. The patient is not nervous/anxious.   ? ?Patient Active Problem List  ? Diagnosis Date Noted  ? TMJ syndrome 09/15/2020  ? 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 S/P total hysterectomy 07/30/2020  ? Pre-diabetes 07/07/2015  ? Major depression single episode, in partial remission ([place]) 06/29/2015  ? Acid reflux 06/29/2015  ? Carpal tunnel syndrome 06/29/2015  ? Hypothyroidism 06/29/2015  ? Paroxysmal digital cyanosis 06/29/2015  ? OSA (obstructive sleep apnea) 06/29/2015  ? Hyperlipidemia, mild 06/29/2015  ? History of endometrial cancer 12/27/2014  ? ? ?Allergies  ?Allergen Reactions  ? Bee Venom Shortness Of Breath and Swelling  ? Topiramate Swelling, Anxiety and Rash  ?  "severe watery eyes"; "swelling and reddening of face"   ? ? ?Past Surgical History:  ?Procedure Laterality Date  ? BREAST REDUCTION SURGERY  1996  ? BREAST SURGERY    ? COLONOSCOPY WITH PROPOFOL N/A 08/24/2017  ? Procedure: COLONOSCOPY WITH PROPOFOL;  Surgeon: Toledo, Benay Pike, MD;  Location: [facility] ENDOSCOPY;  Service: Gastroenterology;  Laterality: N/A;  ? ROBOTIC ASSISTED TOTAL HYSTERECTOMY  2016  ? atypical endometrial hyperplasia  ? TOTAL ABDOMINAL HYSTERECTOMY    ? ? ?Social History  ? ?Tobacco Use  ? Smoking status: Never  ? Smokeless tobacco: Never  ?Vaping Use  ? Vaping Use: Never used  ?Substance Use Topics  ? Alcohol use: Yes  ?  Alcohol/week: 2.0 standard drinks  ?  Types: 2 Standard drinks or equivalent per week  ?  Comment: occasional  ? Drug use: No  ? ? ? ?Medication list has been reviewed and updated. ? ?Current Meds  ?Medication Sig  ? butalbital-acetaminophen-caffeine (FIORICET) 50-325-40 MG tablet Take 1 tablet by mouth every 6 (six) hours as needed.   ? calcium-vitamin D (OSCAL WITH D) 500-200 MG-UNIT tablet Take 1 tablet by mouth.  ? cetirizine (ZYRTEC) 10 MG chewable tablet Chew 10 mg by mouth daily.  ? DULoxetine (CYMBALTA) 60 MG capsule Take 60 mg by mouth daily.  ? Galcanezumab-gnlm (EMGALITY) 120 MG/ML SOSY Inject 120 mg into the skin every 30 (thirty) days.  ? ibuprofen (ADVIL) 600 MG tablet Take 1 tablet (600 mg  total) by mouth every 6 (six) hours as needed.  ? liothyronine (CYTOMEL) 5 MCG tablet Take 5 mcg by mouth daily.  ? meloxicam (MOBIC) 15 MG tablet Take 1 tablet (15 mg total) by mouth daily.  ? NP THYROID 90 MG tablet Take 135 mg by mouth daily.  ? propranolol ER (INDERAL LA) 60 MG 24 hr capsule Take 1 capsule by mouth daily.  ? rizatriptan (MAXALT) 10 MG tablet Take 10 mg by mouth as needed for migraine. May repeat in 2 hours if needed  ? Selenium 200 MCG CAPS Take 1 capsule by mouth daily.  ? vitamin B-12 (CYANOCOBALAMIN) 500 MCG tablet Take 500 mcg by mouth daily.  ? zinc gluconate 50 MG tablet Take 50 mg by mouth daily.  ? ? ? ?  09/17/2021  ?  9:49 AM 08/07/2021  ?  2:48 PM 09/15/2020  ? 10:01 AM 01/07/2020  ?  2:01 PM  ?GAD 7 : Generalized Anxiety Score  ?Nervous, Anxious, on Edge 0 0 3 0  ?Control/stop worrying 0 0 3 0  ?Worry too much -  different things 0 0 3 0  ?Trouble relaxing 0 1 1 0  ?Restless 0 0 1 0  ?Easily annoyed or irritable 0 0 0 0  ?Afraid - awful might happen 0 0 0 0  ?Total GAD 7 Score 0 1 11 0  ?Anxiety Difficulty  Not difficult at all Not difficult at all Not difficult at all  ? ? ? ?  09/17/2021  ?  9:49 AM  ?Depression screen PHQ 2/9  ?Decreased Interest 0  ?Down, Depressed, Hopeless 0  ?PHQ - 2 Score 0  ?Altered sleeping 1  ?Tired, decreased energy 1  ?Change in appetite 0  ?Feeling bad or failure about yourself  0  ?Trouble concentrating 0  ?Moving slowly or fidgety/restless 0  ?Suicidal thoughts 0  ?PHQ-9 Score 2  ?Difficult doing work/chores Not difficult at all  ? ? ?BP Readings from Last 3 Encounters:  ?09/17/21 134/88  ?08/07/21 134/70  ?09/15/20 110/80  ? ? ?Physical Exam ?Vitals and nursing note reviewed.  ?Constitutional:   ?   General: She is not in acute distress. ?   Appearance: She is well-developed.  ?HENT:  ?   Head: Normocephalic and atraumatic.  ?   Right Ear: Tympanic membrane and ear canal normal.  ?   Left Ear: Tympanic membrane and ear canal normal.  ?   Nose:  ?   Right  Sinus: No maxillary sinus tenderness.  ?   Left Sinus: No maxillary sinus tenderness.  ?Eyes:  ?   General: No scleral icterus.    ?   Right eye: No discharge.     ?   Left eye: No discharge.  ?   Conj

## 2021-09-18 LAB — CBC WITH DIFFERENTIAL/PLATELET
Basophils Absolute: 0 10*3/uL (ref 0.0–0.2)
Basos: 0 %
EOS (ABSOLUTE): 0.2 10*3/uL (ref 0.0–0.4)
Eos: 3 %
Hematocrit: 38.3 % (ref 34.0–46.6)
Hemoglobin: 12.4 g/dL (ref 11.1–15.9)
Immature Grans (Abs): 0 10*3/uL (ref 0.0–0.1)
Immature Granulocytes: 0 %
Lymphocytes Absolute: 1.6 10*3/uL (ref 0.7–3.1)
Lymphs: 27 %
MCH: 28.1 pg (ref 26.6–33.0)
MCHC: 32.4 g/dL (ref 31.5–35.7)
MCV: 87 fL (ref 79–97)
Monocytes Absolute: 0.4 10*3/uL (ref 0.1–0.9)
Monocytes: 7 %
Neutrophils Absolute: 3.6 10*3/uL (ref 1.4–7.0)
Neutrophils: 63 %
Platelets: 295 10*3/uL (ref 150–450)
RBC: 4.42 x10E6/uL (ref 3.77–5.28)
RDW: 12.9 % (ref 11.7–15.4)
WBC: 5.7 10*3/uL (ref 3.4–10.8)

## 2021-09-18 LAB — LIPID PANEL
Chol/HDL Ratio: 4.2 ratio (ref 0.0–4.4)
Cholesterol, Total: 210 mg/dL — ABNORMAL HIGH (ref 100–199)
HDL: 50 mg/dL (ref >39)
LDL Chol Calc (NIH): 144 mg/dL — ABNORMAL HIGH (ref 0–99)
Triglycerides: 88 mg/dL (ref 0–149)
VLDL Cholesterol Cal: 16 mg/dL (ref 5–40)

## 2021-09-18 LAB — COMPREHENSIVE METABOLIC PANEL
ALT: 21 IU/L (ref 0–32)
AST: 19 IU/L (ref 0–40)
Albumin/Globulin Ratio: 1.5 (ref 1.2–2.2)
Albumin: 4 g/dL (ref 3.8–4.9)
Alkaline Phosphatase: 124 IU/L — ABNORMAL HIGH (ref 44–121)
BUN/Creatinine Ratio: 14 (ref 9–23)
BUN: 10 mg/dL (ref 6–24)
Bilirubin Total: 0.3 mg/dL (ref 0.0–1.2)
CO2: 25 mmol/L (ref 20–29)
Calcium: 9.3 mg/dL (ref 8.7–10.2)
Chloride: 106 mmol/L (ref 96–106)
Creatinine, Ser: 0.72 mg/dL (ref 0.57–1.00)
Globulin, Total: 2.6 g/dL (ref 1.5–4.5)
Glucose: 110 mg/dL — ABNORMAL HIGH (ref 70–99)
Potassium: 4.3 mmol/L (ref 3.5–5.2)
Sodium: 142 mmol/L (ref 134–144)
Total Protein: 6.6 g/dL (ref 6.0–8.5)
eGFR: 100 mL/min/{1.73_m2} (ref >59)

## 2021-09-18 LAB — HEMOGLOBIN A1C
Est. average glucose Bld gHb Est-mCnc: 108 mg/dL
Hgb A1c MFr Bld: 5.4 % (ref 4.8–5.6)

## 2021-11-30 ENCOUNTER — Ambulatory Visit (INDEPENDENT_AMBULATORY_CARE_PROVIDER_SITE_OTHER): Payer: BC Managed Care – PPO | Admitting: Internal Medicine

## 2021-11-30 ENCOUNTER — Encounter: Payer: Self-pay | Admitting: Internal Medicine

## 2021-11-30 ENCOUNTER — Telehealth: Payer: Self-pay

## 2021-11-30 VITALS — BP 118/76 | HR 75 | Ht 62.0 in | Wt 249.0 lb

## 2021-11-30 DIAGNOSIS — G4733 Obstructive sleep apnea (adult) (pediatric): Secondary | ICD-10-CM | POA: Diagnosis not present

## 2021-11-30 DIAGNOSIS — R400 Somnolence: Secondary | ICD-10-CM

## 2021-11-30 MED ORDER — MODAFINIL 200 MG PO TABS: 200.0000 mg | ORAL_TABLET | Freq: Every day | ORAL | 0 refills | Status: DC

## 2021-11-30 NOTE — Progress Notes (Signed)
Date:  11/30/2021   Name:  Marilyn Robertson   DOB:  1970-11-20   MRN:  [id number]   Chief Complaint: CPAP Compliance  HPI OSA: Patient is currently compliant with the usage of BiPAP 21/16 cm H20 device, and is receiving therapeutic benefit. Patient is using her Bipap every night since receiving it. She uses the application on her phone called [email or web address]. The app states Adherence is 97%.  She has used it every night for the past month on review.  Usage averages about 6 hours per night - no nights under 4 hours.  Average pressure 15, AHI 1-2. She feels that she sleeps well but is still very sleepy in the afternoon.  She is struggling to function and wonders about medication to help.   Lab Results  Component Value Date   NA 142 09/17/2021   K 4.3 09/17/2021   CO2 25 09/17/2021   GLUCOSE 110 (H) 09/17/2021   BUN 10 09/17/2021   CREATININE 0.72 09/17/2021   CALCIUM 9.3 09/17/2021   EGFR 100 09/17/2021   GFRNONAA >60 06/08/2017   Lab Results  Component Value Date   CHOL 210 (H) 09/17/2021   HDL 50 09/17/2021   LDLCALC 144 (H) 09/17/2021   TRIG 88 09/17/2021   CHOLHDL 4.2 09/17/2021   No results found for: "TSH" Lab Results  Component Value Date   HGBA1C 5.4 09/17/2021   Lab Results  Component Value Date   WBC 5.7 09/17/2021   HGB 12.4 09/17/2021   HCT 38.3 09/17/2021   MCV 87 09/17/2021   PLT 295 09/17/2021   Lab Results  Component Value Date   ALT 21 09/17/2021   AST 19 09/17/2021   ALKPHOS 124 (H) 09/17/2021   BILITOT 0.3 09/17/2021   No results found for: "25OHVITD2", "25OHVITD3", "VD25OH"   Review of Systems  Constitutional:  Negative for chills, fatigue and fever.       Somnolence during the day is unchanged despite treatment of OSA  Respiratory:  Negative for shortness of breath.   Cardiovascular:  Negative for chest pain and palpitations.  Neurological:  Negative for dizziness, light-headedness and headaches.  Psychiatric/Behavioral:  Negative for  dysphoric mood and sleep disturbance. The patient is not nervous/anxious.     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, not intractable 09/14/2020   S/P total hysterectomy 07/30/2020   Pre-diabetes 07/07/2015   Major depression single episode, in partial remission ([place]) 06/29/2015   Acid reflux 06/29/2015   Carpal tunnel syndrome 06/29/2015   Hypothyroidism 06/29/2015   Paroxysmal digital cyanosis 06/29/2015   OSA (obstructive sleep apnea) 06/29/2015   Hyperlipidemia, mild 06/29/2015   History of endometrial cancer 12/27/2014    Allergies  Allergen Reactions   Bee Venom Shortness Of Breath and Swelling   Topiramate Swelling, Anxiety and Rash    "severe watery eyes"; "swelling and reddening of face"     Past Surgical History:  Procedure Laterality Date   BREAST REDUCTION SURGERY  1996   BREAST SURGERY     COLONOSCOPY WITH PROPOFOL N/A 08/24/2017   Procedure: COLONOSCOPY WITH PROPOFOL;  Surgeon: Toledo, Benay Pike, MD;  Location: [facility] ENDOSCOPY;  Service: Gastroenterology;  Laterality: N/A;   ROBOTIC ASSISTED TOTAL HYSTERECTOMY  2016   atypical endometrial hyperplasia   TOTAL ABDOMINAL HYSTERECTOMY      Social History   Tobacco Use   Smoking status: Never   Smokeless tobacco: Never  Vaping Use   Vaping Use: Never used  Substance Use Topics   Alcohol use: Yes    Alcohol/week: 2.0 standard drinks of alcohol    Types: 2 Standard drinks or equivalent per week    Comment: occasional   Drug use: No     Medication list has been reviewed and updated.  Current Meds  Medication Sig   butalbital-acetaminophen-caffeine (FIORICET) 50-325-40 MG tablet Take 1 tablet by mouth every 6 (six) hours as needed.    calcium-vitamin D (OSCAL WITH D) 500-200 MG-UNIT tablet Take 1 tablet by mouth.   cetirizine (ZYRTEC) 10 MG chewable tablet Chew 10 mg by mouth daily.   DULoxetine (CYMBALTA) 60  MG capsule Take 60 mg by mouth daily.   Galcanezumab-gnlm (EMGALITY) 120 MG/ML SOSY Inject 120 mg into the skin every 30 (thirty) days.   ibuprofen (ADVIL) 600 MG tablet Take 1 tablet (600 mg total) by mouth every 6 (six) hours as needed.   liothyronine (CYTOMEL) 5 MCG tablet Take 5 mcg by mouth daily.   meloxicam (MOBIC) 15 MG tablet Take 1 tablet (15 mg total) by mouth daily.   NP THYROID 90 MG tablet Take 135 mg by mouth daily.   propranolol ER (INDERAL LA) 60 MG 24 hr capsule Take 1 capsule by mouth daily.   rizatriptan (MAXALT) 10 MG tablet Take 10 mg by mouth as needed for migraine. May repeat in 2 hours if needed   Selenium 200 MCG CAPS Take 1 capsule by mouth daily.   vitamin B-12 (CYANOCOBALAMIN) 500 MCG tablet Take 500 mcg by mouth daily.   zinc gluconate 50 MG tablet Take 50 mg by mouth daily.       09/17/2021    9:49 AM 08/07/2021    2:48 PM 09/15/2020   10:01 AM 01/07/2020    2:01 PM  GAD 7 : Generalized Anxiety Score  Nervous, Anxious, on Edge 0 0 3 0  Control/stop worrying 0 0 3 0  Worry too much - different things 0 0 3 0  Trouble relaxing 0 1 1 0  Restless 0 0 1 0  Easily annoyed or irritable 0 0 0 0  Afraid - awful might happen 0 0 0 0  Total GAD 7 Score 0 1 11 0  Anxiety Difficulty  Not difficult at all Not difficult at all Not difficult at all       09/17/2021    9:49 AM  Depression screen PHQ 2/9  Decreased Interest 0  Down, Depressed, Hopeless 0  PHQ - 2 Score 0  Altered sleeping 1  Tired, decreased energy 1  Change in appetite 0  Feeling bad or failure about yourself  0  Trouble concentrating 0  Moving slowly or fidgety/restless 0  Suicidal thoughts 0  PHQ-9 Score 2  Difficult doing work/chores Not difficult at all    BP Readings from Last 3 Encounters:  11/30/21 118/76  09/17/21 134/88  08/07/21 134/70    Physical Exam Vitals and nursing note reviewed.  Constitutional:      General: She is not in acute distress.    Appearance: She is  well-developed. She is obese.  HENT:     Head: Normocephalic and atraumatic.  Neck:     Vascular: No carotid bruit.  Cardiovascular:     Rate and Rhythm: Normal rate and regular rhythm.  Pulmonary:     Effort: Pulmonary effort is normal. No respiratory distress.     Breath sounds: No wheezing or rhonchi.  Musculoskeletal:  Cervical back: Normal range of motion.     Right lower leg: No edema.     Left lower leg: No edema.  Lymphadenopathy:     Cervical: No cervical adenopathy.  Skin:    General: Skin is warm and dry.     Findings: No rash.  Neurological:     General: No focal deficit present.     Mental Status: She is alert and oriented to person, place, and time.  Psychiatric:        Mood and Affect: Mood normal.        Behavior: Behavior normal.     Wt Readings from Last 3 Encounters:  11/30/21 249 lb (112.9 kg)  09/17/21 246 lb (111.6 kg)  08/07/21 250 lb (113.4 kg)    BP 118/76   Pulse 75   Ht '5\' 2"'  (1.575 m)   Wt 249 lb (112.9 kg)   SpO2 97%   BMI 45.54 kg/m   Assessment and Plan: 1. OSA (obstructive sleep apnea) Excellent compliance with Bipap. 97% overall since starting in mid March.  100% over the last month 30/30 days > 4 hours per night (averaging 6 hours) AHI 1-2 much improved.  2. Daytime somnolence Persistent somnolence despite adequate treatment of OSA. Begin Modafinil and follow up in 2 months. - modafinil (PROVIGIL) 200 MG tablet; Take 1 tablet (200 mg total) by mouth daily.  Dispense: 30 tablet; Refill: 0   Partially dictated using Editor, commissioning. Any errors are unintentional.  Halina Maidens, MD [facility]  11/30/2021

## 2021-11-30 NOTE — Telephone Encounter (Signed)
PA completed waiting on insurance approval.  Key: [place]

## 2021-12-02 NOTE — Telephone Encounter (Signed)
Denied  KP 

## 2021-12-17 ENCOUNTER — Encounter: Payer: Self-pay | Admitting: Internal Medicine

## 2021-12-17 NOTE — Telephone Encounter (Signed)
Please review.  KP

## 2021-12-23 ENCOUNTER — Ambulatory Visit: Payer: BC Managed Care – PPO | Admitting: Internal Medicine

## 2021-12-25 ENCOUNTER — Ambulatory Visit
Admission: RE | Admit: 2021-12-25 | Discharge: 2021-12-25 | Disposition: A | Discharge status: Home / Self Care | Payer: BC Managed Care – PPO | Attending: Internal Medicine | Admitting: Internal Medicine

## 2021-12-25 ENCOUNTER — Ambulatory Visit
Admission: RE | Admit: 2021-12-25 | Discharge: 2021-12-25 | Disposition: A | Discharge status: Home / Self Care | Payer: BC Managed Care – PPO | Source: Ambulatory Visit | Attending: Internal Medicine | Admitting: Internal Medicine

## 2021-12-25 ENCOUNTER — Encounter: Payer: Self-pay | Admitting: Internal Medicine

## 2021-12-25 ENCOUNTER — Ambulatory Visit: Payer: BC Managed Care – PPO | Admitting: Internal Medicine

## 2021-12-25 VITALS — BP 136/70 | HR 78 | Ht 62.0 in | Wt 249.0 lb

## 2021-12-25 DIAGNOSIS — M5441 Lumbago with sciatica, right side: Secondary | ICD-10-CM

## 2021-12-25 MED ORDER — CYCLOBENZAPRINE HCL 10 MG PO TABS: 10.0000 mg | ORAL_TABLET | Freq: Three times a day (TID) | ORAL | 0 refills | Status: DC | PRN

## 2021-12-25 MED ORDER — MELOXICAM 15 MG PO TABS: 15.0000 mg | ORAL_TABLET | Freq: Every day | ORAL | 0 refills | Status: DC

## 2021-12-25 NOTE — Progress Notes (Signed)
Date:  12/25/2021   Name:  Marilyn Robertson   DOB:  [birth year]   MRN:  [id number]   Chief Complaint: Back Pain (Lumbar with right-sided sciatica. Wants PT referral.)  Back Pain This is a new problem. The current episode started in the past 7 days. The problem occurs constantly. The problem has been gradually improving since onset. The pain is present in the lumbar spine. The quality of the pain is described as aching. Radiates to: right lower leg. The pain is moderate. The symptoms are aggravated by twisting and bending. Associated symptoms include tingling (in right lower leg). Pertinent negatives include no abdominal pain, bladder incontinence, bowel incontinence, chest pain, dysuria, fever, paresthesias, perianal numbness or weakness. She has tried bed rest and heat (and some tylenol) for the symptoms. The treatment provided mild relief.    Lab Results  Component Value Date   NA 142 09/17/2021   K 4.3 09/17/2021   CO2 25 09/17/2021   GLUCOSE 110 (H) 09/17/2021   BUN 10 09/17/2021   CREATININE 0.72 09/17/2021   CALCIUM 9.3 09/17/2021   EGFR 100 09/17/2021   GFRNONAA >60 06/08/2017   Lab Results  Component Value Date   CHOL 210 (H) 09/17/2021   HDL 50 09/17/2021   LDLCALC 144 (H) 09/17/2021   TRIG 88 09/17/2021   CHOLHDL 4.2 09/17/2021   No results found for: "TSH" Lab Results  Component Value Date   HGBA1C 5.4 09/17/2021   Lab Results  Component Value Date   WBC 5.7 09/17/2021   HGB 12.4 09/17/2021   HCT 38.3 09/17/2021   MCV 87 09/17/2021   PLT 295 09/17/2021   Lab Results  Component Value Date   ALT 21 09/17/2021   AST 19 09/17/2021   ALKPHOS 124 (H) 09/17/2021   BILITOT 0.3 09/17/2021   No results found for: "25OHVITD2", "25OHVITD3", "VD25OH"   Review of Systems  Constitutional:  Negative for chills, fatigue and fever.  Respiratory:  Negative for chest tightness and shortness of breath.   Cardiovascular:  Positive for leg swelling (mild ankle edema on  left). Negative for chest pain.  Gastrointestinal:  Negative for abdominal pain and bowel incontinence.  Genitourinary:  Negative for bladder incontinence and dysuria.  Musculoskeletal:  Positive for back pain.  Neurological:  Positive for tingling (in right lower leg). Negative for weakness and paresthesias.    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, not intractable 09/14/2020   S/P total hysterectomy 07/30/2020   Pre-diabetes 07/07/2015   Major depression single episode, in partial remission ([place]) 06/29/2015   Acid reflux 06/29/2015   Carpal tunnel syndrome 06/29/2015   Hypothyroidism 06/29/2015   Paroxysmal digital cyanosis 06/29/2015   OSA (obstructive sleep apnea) 06/29/2015   Hyperlipidemia, mild 06/29/2015   History of endometrial cancer 12/27/2014    Allergies  Allergen Reactions   Bee Venom Shortness Of Breath and Swelling   Topiramate Swelling, Anxiety and Rash    "severe watery eyes"; "swelling and reddening of face"     Past Surgical History:  Procedure Laterality Date   BREAST REDUCTION SURGERY  1996   BREAST SURGERY     COLONOSCOPY WITH PROPOFOL N/A 08/24/2017   Procedure: COLONOSCOPY WITH PROPOFOL;  Surgeon: Toledo, Benay Pike, MD;  Location: [facility] ENDOSCOPY;  Service: Gastroenterology;  Laterality: N/A;   ROBOTIC ASSISTED TOTAL HYSTERECTOMY  2016   atypical endometrial hyperplasia   TOTAL ABDOMINAL HYSTERECTOMY  Social History   Tobacco Use   Smoking status: Never   Smokeless tobacco: Never  Vaping Use   Vaping Use: Never used  Substance Use Topics   Alcohol use: Yes    Alcohol/week: 2.0 standard drinks of alcohol    Types: 2 Standard drinks or equivalent per week    Comment: occasional   Drug use: No     Medication list has been reviewed and updated.  Current Meds  Medication Sig   butalbital-acetaminophen-caffeine (FIORICET) 50-325-40 MG tablet  Take 1 tablet by mouth every 6 (six) hours as needed.    calcium-vitamin D (OSCAL WITH D) 500-200 MG-UNIT tablet Take 1 tablet by mouth.   cetirizine (ZYRTEC) 10 MG chewable tablet Chew 10 mg by mouth daily.   cyclobenzaprine (FLEXERIL) 10 MG tablet Take 1 tablet (10 mg total) by mouth 3 (three) times daily as needed for muscle spasms.   DULoxetine (CYMBALTA) 60 MG capsule Take 60 mg by mouth daily.   Galcanezumab-gnlm (EMGALITY) 120 MG/ML SOSY Inject 120 mg into the skin every 30 (thirty) days.   ibuprofen (ADVIL) 600 MG tablet Take 1 tablet (600 mg total) by mouth every 6 (six) hours as needed.   liothyronine (CYTOMEL) 5 MCG tablet Take 5 mcg by mouth daily.   meloxicam (MOBIC) 15 MG tablet Take 1 tablet (15 mg total) by mouth daily.   modafinil (PROVIGIL) 200 MG tablet Take 1 tablet (200 mg total) by mouth daily.   NP THYROID 90 MG tablet Take 135 mg by mouth daily.   propranolol ER (INDERAL LA) 60 MG 24 hr capsule Take 1 capsule by mouth daily.   rizatriptan (MAXALT) 10 MG tablet Take 10 mg by mouth as needed for migraine. May repeat in 2 hours if needed   Selenium 200 MCG CAPS Take 1 capsule by mouth daily.   vitamin B-12 (CYANOCOBALAMIN) 500 MCG tablet Take 500 mcg by mouth daily.   zinc gluconate 50 MG tablet Take 50 mg by mouth daily.   [DISCONTINUED] meloxicam (MOBIC) 15 MG tablet Take 1 tablet (15 mg total) by mouth daily.       12/25/2021   11:27 AM 09/17/2021    9:49 AM 08/07/2021    2:48 PM 09/15/2020   10:01 AM  GAD 7 : Generalized Anxiety Score  Nervous, Anxious, on Edge 1 0 0 3  Control/stop worrying 1 0 0 3  Worry too much - different things 1 0 0 3  Trouble relaxing 1 0 1 1  Restless 1 0 0 1  Easily annoyed or irritable 1 0 0 0  Afraid - awful might happen 1 0 0 0  Total GAD 7 Score 7 0 1 11  Anxiety Difficulty Somewhat difficult  Not difficult at all Not difficult at all       12/25/2021   11:27 AM 09/17/2021    9:49 AM 08/07/2021    2:48 PM  Depression screen PHQ  2/9  Decreased Interest 1 0 0  Down, Depressed, Hopeless 1 0 0  PHQ - 2 Score 2 0 0  Altered sleeping '2 1 1  ' Tired, decreased energy '2 1 2  ' Change in appetite 0 0 0  Feeling bad or failure about yourself  0 0 0  Trouble concentrating 1 0 1  Moving slowly or fidgety/restless 2 0 0  Suicidal thoughts 0 0 0  PHQ-9 Score '9 2 4  ' Difficult doing work/chores Somewhat difficult Not difficult at all Somewhat difficult    BP Readings  from Last 3 Encounters:  12/25/21 136/70  11/30/21 118/76  09/17/21 134/88    Physical Exam Constitutional:      General: She is not in acute distress. Cardiovascular:     Rate and Rhythm: Normal rate and regular rhythm.  Pulmonary:     Effort: Pulmonary effort is normal.     Breath sounds: No wheezing or rhonchi.  Musculoskeletal:     Cervical back: Normal range of motion.     Lumbar back: Tenderness (over right SI region) present. Positive right straight leg raise test. Negative left straight leg raise test.  Lymphadenopathy:     Cervical: No cervical adenopathy.  Neurological:     Mental Status: She is alert.     Sensory: Sensation is intact.     Motor: Motor function is intact.     Gait: Gait is intact.     Deep Tendon Reflexes:     Reflex Scores:      Bicep reflexes are 3+ on the right side and 3+ on the left side.      Patellar reflexes are 4+ on the right side and 4+ on the left side.    Wt Readings from Last 3 Encounters:  12/25/21 249 lb (112.9 kg)  11/30/21 249 lb (112.9 kg)  09/17/21 246 lb (111.6 kg)    BP 136/70   Pulse 78   Ht '5\' 2"'  (1.575 m)   Wt 249 lb (112.9 kg)   SpO2 95%   BMI 45.54 kg/m   Assessment and Plan: 1. Acute right-sided low back pain with right-sided sciatica And SI pain/strain Recommend NSAID and muscle relaxants with heat/ice Obtain imaging  Consider PT referral if needed - DG Lumbar Spine Complete; Future - meloxicam (MOBIC) 15 MG tablet; Take 1 tablet (15 mg total) by mouth daily.  Dispense: 30  tablet; Refill: 0 - cyclobenzaprine (FLEXERIL) 10 MG tablet; Take 1 tablet (10 mg total) by mouth 3 (three) times daily as needed for muscle spasms.  Dispense: 30 tablet; Refill: 0   Partially dictated using Editor, commissioning. Any errors are unintentional.  Halina Maidens, MD [facility]  12/25/2021

## 2022-01-21 ENCOUNTER — Other Ambulatory Visit: Payer: Self-pay | Admitting: Internal Medicine

## 2022-01-21 DIAGNOSIS — M5441 Lumbago with sciatica, right side: Secondary | ICD-10-CM

## 2022-01-22 NOTE — Telephone Encounter (Signed)
Requested medication (s) are due for refill today:   Provider to review Flexeril; Yes for Mobic  Requested medication (s) are on the active medication list:   Yes for both  Future visit scheduled:   Yes   Last ordered: Flexeril and Mobic 12/25/2021 #30, 0 refills each.  Returned because non delegated refill.   Requested Prescriptions  Pending Prescriptions Disp Refills   cyclobenzaprine (FLEXERIL) 10 MG tablet [Pharmacy Med Name: Cyclobenzaprine HCl 10 MG Oral Tablet] 30 tablet 0    Sig: Take 1 tablet by mouth three times daily as needed for muscle spasm     Not Delegated - Analgesics:  Muscle Relaxants Failed - 01/21/2022  2:09 PM      Failed - This refill cannot be delegated      Passed - Valid encounter within last 6 months    Recent Outpatient Visits           4 weeks ago Acute right-sided low back pain with right-sided sciatica   [facility] Glean Hess, MD   1 month ago OSA (obstructive sleep apnea)   [facility] Glean Hess, MD   4 months ago Annual physical exam   [facility] Glean Hess, MD   5 months ago OSA (obstructive sleep apnea)   [facility] Glean Hess, MD   1 year ago Annual physical exam   [facility]an [facility], MD       Future Appointments             In 2 weeks Glean Hess, MD [facility] [place]   In 8 months Glean Hess, MD [facility]             meloxicam (MOBIC) 15 MG tablet [Pharmacy Med Name: Meloxicam 15 MG Oral Tablet] 30 tablet 0    Sig: Take 1 tablet by mouth once daily     Analgesics:  COX2 Inhibitors Failed - 01/21/2022  2:09 PM      Failed - Manual Review: Labs are only required if the patient has taken medication for more than 8 weeks.      Passed - HGB in normal range and within 360 days    Hemoglobin  Date Value Ref Range Status  09/17/2021 12.4 11.1 - 15.9 g/dL Final         Passed - Cr in normal range and  within 360 days    Creatinine, Ser  Date Value Ref Range Status  09/17/2021 0.72 0.57 - 1.00 mg/dL Final         Passed - HCT in normal range and within 360 days    Hematocrit  Date Value Ref Range Status  09/17/2021 38.3 34.0 - 46.6 % Final         Passed - AST in normal range and within 360 days    AST  Date Value Ref Range Status  09/17/2021 19 0 - 40 IU/L Final         Passed - ALT in normal range and within 360 days    ALT  Date Value Ref Range Status  09/17/2021 21 0 - 32 IU/L Final         Passed - eGFR is 30 or above and within 360 days    GFR calc Af Amer  Date Value Ref Range Status  06/08/2017 >60 >60 mL/min Final    Comment:    (NOTE) The eGFR has been  calculated using the CKD EPI equation. This calculation has not been validated in all clinical situations. eGFR's persistently <60 mL/min signify possible Chronic Kidney Disease.    GFR calc non Af Amer  Date Value Ref Range Status  06/08/2017 >60 >60 mL/min Final   eGFR  Date Value Ref Range Status  09/17/2021 100 >59 mL/min/1.73 Final         Passed - Patient is not pregnant      Passed - Valid encounter within last 12 months    Recent Outpatient Visits           4 weeks ago Acute right-sided low back pain with right-sided sciatica   [facility] Glean Hess, MD   1 month ago OSA (obstructive sleep apnea)   [facility] Glean Hess, MD   4 months ago Annual physical exam   [facility] Glean Hess, MD   5 months ago OSA (obstructive sleep apnea)   [facility] Glean Hess, MD   1 year ago Annual physical exam   [facility] Glean Hess, MD       Future Appointments             In 2 weeks Army Melia Jesse Sans, MD [facility] [place]   In 8 months Army Melia, Jesse Sans, MD [facility], [facility]

## 2022-02-05 ENCOUNTER — Other Ambulatory Visit: Payer: Self-pay | Admitting: Internal Medicine

## 2022-02-05 ENCOUNTER — Telehealth: Payer: Self-pay

## 2022-02-05 NOTE — Telephone Encounter (Signed)
Pt has a 2 month follow up for medication called provigil. Medication was not covered by insurance. Pt is not taking the medication. She does not need to come in to the appointment. Left VM to call back to confirm cancellation.  KP

## 2022-02-08 ENCOUNTER — Ambulatory Visit: Payer: BC Managed Care – PPO | Admitting: Internal Medicine

## 2022-02-08 ENCOUNTER — Encounter: Payer: Self-pay | Admitting: Internal Medicine

## 2022-02-08 VITALS — BP 132/88 | HR 90 | Ht 62.0 in | Wt 252.2 lb

## 2022-02-08 DIAGNOSIS — M5441 Lumbago with sciatica, right side: Secondary | ICD-10-CM

## 2022-02-08 MED ORDER — PREDNISONE 10 MG PO TABS: ORAL_TABLET | ORAL | 0 refills | Status: AC

## 2022-02-08 NOTE — Progress Notes (Signed)
Date:  02/08/2022   Name:  Marilyn Robertson   DOB:  08-02-1970   MRN:  [id number]   Chief Complaint: Back Pain (Lower Back pain - gluteus pain is the worst / Right Calf Pain.)  Back Pain This is a new problem. The current episode started more than 1 month ago. The problem occurs daily. The problem is unchanged. The pain is present in the gluteal. The quality of the pain is described as aching. The pain radiates to the right thigh. The pain is mild. Pertinent negatives include no chest pain. She has tried NSAIDs, muscle relaxant and heat for the symptoms. The treatment provided no relief.    Lab Results  Component Value Date   NA 142 09/17/2021   K 4.3 09/17/2021   CO2 25 09/17/2021   GLUCOSE 110 (H) 09/17/2021   BUN 10 09/17/2021   CREATININE 0.72 09/17/2021   CALCIUM 9.3 09/17/2021   EGFR 100 09/17/2021   GFRNONAA >60 06/08/2017   Lab Results  Component Value Date   CHOL 210 (H) 09/17/2021   HDL 50 09/17/2021   LDLCALC 144 (H) 09/17/2021   TRIG 88 09/17/2021   CHOLHDL 4.2 09/17/2021   No results found for: "TSH" Lab Results  Component Value Date   HGBA1C 5.4 09/17/2021   Lab Results  Component Value Date   WBC 5.7 09/17/2021   HGB 12.4 09/17/2021   HCT 38.3 09/17/2021   MCV 87 09/17/2021   PLT 295 09/17/2021   Lab Results  Component Value Date   ALT 21 09/17/2021   AST 19 09/17/2021   ALKPHOS 124 (H) 09/17/2021   BILITOT 0.3 09/17/2021   No results found for: "25OHVITD2", "25OHVITD3", "VD25OH"   Review of Systems  Constitutional:  Negative for appetite change, fatigue and unexpected weight change.  Respiratory:  Negative for chest tightness and shortness of breath.   Cardiovascular:  Negative for chest pain.  Musculoskeletal:  Positive for back pain and myalgias (right calf tightness).  Psychiatric/Behavioral:  Negative for sleep disturbance. The patient is not nervous/anxious.     Patient Active Problem List   Diagnosis Date Noted   TMJ syndrome  09/15/2020   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, not intractable 09/14/2020   S/P total hysterectomy 07/30/2020   Pre-diabetes 07/07/2015   Major depression single episode, in partial remission ([place]) 06/29/2015   Acid reflux 06/29/2015   Carpal tunnel syndrome 06/29/2015   Hypothyroidism 06/29/2015   Paroxysmal digital cyanosis 06/29/2015   OSA (obstructive sleep apnea) 06/29/2015   Hyperlipidemia, mild 06/29/2015   History of endometrial cancer 12/27/2014    Allergies  Allergen Reactions   Bee Venom Shortness Of Breath and Swelling   Topiramate Swelling, Anxiety and Rash    "severe watery eyes"; "swelling and reddening of face"     Past Surgical History:  Procedure Laterality Date   BREAST REDUCTION SURGERY  1996   BREAST SURGERY     COLONOSCOPY WITH PROPOFOL N/A 08/24/2017   Procedure: COLONOSCOPY WITH PROPOFOL;  Surgeon: Toledo, Benay Pike, MD;  Location: [facility] ENDOSCOPY;  Service: Gastroenterology;  Laterality: N/A;   ROBOTIC ASSISTED TOTAL HYSTERECTOMY  2016   atypical endometrial hyperplasia   TOTAL ABDOMINAL HYSTERECTOMY      Social History   Tobacco Use   Smoking status: Never   Smokeless tobacco: Never  Vaping Use   Vaping Use: Never used  Substance Use Topics   Alcohol use: Yes    Alcohol/week: 2.0 standard drinks of alcohol  Types: 2 Standard drinks or equivalent per week    Comment: occasional   Drug use: No     Medication list has been reviewed and updated.  Current Meds  Medication Sig   butalbital-acetaminophen-caffeine (FIORICET) 50-325-40 MG tablet Take 1 tablet by mouth every 6 (six) hours as needed.    calcium-vitamin D (OSCAL WITH D) 500-200 MG-UNIT tablet Take 1 tablet by mouth.   cetirizine (ZYRTEC) 10 MG chewable tablet Chew 10 mg by mouth daily.   clonazePAM (KLONOPIN) 1 MG tablet Take 1 mg by mouth daily as needed.   cyclobenzaprine (FLEXERIL) 10 MG tablet Take 1 tablet by mouth three times daily as needed for muscle  spasm   DULoxetine (CYMBALTA) 60 MG capsule Take 60 mg by mouth daily.   Galcanezumab-gnlm (EMGALITY) 120 MG/ML SOSY Inject 120 mg into the skin every 30 (thirty) days.   ibuprofen (ADVIL) 600 MG tablet Take 1 tablet (600 mg total) by mouth every 6 (six) hours as needed.   levothyroxine (SYNTHROID) 100 MCG tablet Take 100 mcg by mouth daily.   liothyronine (CYTOMEL) 5 MCG tablet Take 5 mcg by mouth daily.   meloxicam (MOBIC) 15 MG tablet Take 1 tablet by mouth once daily   propranolol ER (INDERAL LA) 60 MG 24 hr capsule Take 1 capsule by mouth daily.   rizatriptan (MAXALT) 10 MG tablet Take 10 mg by mouth as needed for migraine. May repeat in 2 hours if needed   Selenium 200 MCG CAPS Take 1 capsule by mouth daily.   vitamin B-12 (CYANOCOBALAMIN) 500 MCG tablet Take 500 mcg by mouth daily.   zinc gluconate 50 MG tablet Take 50 mg by mouth daily.       12/25/2021   11:27 AM 09/17/2021    9:49 AM 08/07/2021    2:48 PM 09/15/2020   10:01 AM  GAD 7 : Generalized Anxiety Score  Nervous, Anxious, on Edge 1 0 0 3  Control/stop worrying 1 0 0 3  Worry too much - different things 1 0 0 3  Trouble relaxing 1 0 1 1  Restless 1 0 0 1  Easily annoyed or irritable 1 0 0 0  Afraid - awful might happen 1 0 0 0  Total GAD 7 Score 7 0 1 11  Anxiety Difficulty Somewhat difficult  Not difficult at all Not difficult at all       12/25/2021   11:27 AM 09/17/2021    9:49 AM 08/07/2021    2:48 PM  Depression screen PHQ 2/9  Decreased Interest 1 0 0  Down, Depressed, Hopeless 1 0 0  PHQ - 2 Score 2 0 0  Altered sleeping _0 Tired, decreased energy _1 Change in appetite 0 0 0  Feeling bad or failure about yourself  0 0 0  Trouble concentrating 1 0 1  Moving slowly or fidgety/restless 2 0 0  Suicidal thoughts 0 0 0  PHQ-9 Score _2 Difficult doing work/chores Somewhat difficult Not difficult at all Somewhat difficult    BP Readings from Last 3 Encounters:  02/08/22 132/88  12/25/21 136/70   11/30/21 118/76    Physical Exam Vitals and nursing note reviewed.  Constitutional:      General: She is not in acute distress.    Appearance: She is well-developed.  HENT:     Head: Normocephalic and atraumatic.  Cardiovascular:     Rate and Rhythm: Normal rate and regular rhythm.  Pulmonary:  Effort: Pulmonary effort is normal. No respiratory distress.     Breath sounds: No wheezing or rhonchi.  Musculoskeletal:     Cervical back: Normal range of motion.     Lumbar back: Spasms and tenderness present. Positive right straight leg raise test.     Right hip: Decreased range of motion.     Left hip: Normal range of motion.  Skin:    General: Skin is warm and dry.     Findings: No rash.  Neurological:     Mental Status: She is alert and oriented to person, place, and time.     Sensory: Sensation is intact.     Motor: Motor function is intact.     Coordination: Coordination is intact.     Deep Tendon Reflexes:     Reflex Scores:      Patellar reflexes are 3+ on the right side and 3+ on the left side.      Achilles reflexes are 2+ on the right side and 2+ on the left side. Psychiatric:        Mood and Affect: Mood normal.        Behavior: Behavior normal.     Wt Readings from Last 3 Encounters:  02/08/22 252 lb 3.2 oz (114.4 kg)  12/25/21 249 lb (112.9 kg)  11/30/21 249 lb (112.9 kg)    BP 132/88   Pulse 90   Ht _0  (1.575 m)   Wt 252 lb 3.2 oz (114.4 kg)   SpO2 99%   BMI 46.13 kg/m   Assessment and Plan: 1. Acute right-sided low back pain with right-sided sciatica Minimal response to conservative care with Mobic and Flexeril. Continue Flexeril and heat Steroid taper PT referral - predniSONE (DELTASONE) 10 MG tablet; Take 6 on day 1and 2, 5 on day 3 and 4, 4 on day 5 and 6 , 3 on day 7 and 8, 2 on day 9 and 10 and 1 on day 11 and 12 then stop.  Dispense: 42 tablet; Refill: 0 - Ambulatory referral to Physical Therapy   Partially dictated using Dragon  software. Any errors are unintentional.  Halina Maidens, MD [facility]  02/08/2022

## 2022-02-08 NOTE — Telephone Encounter (Signed)
Patient called, left VM to return the call to the office re: upcoming appointment today at 3pm.

## 2022-02-15 ENCOUNTER — Other Ambulatory Visit: Payer: Self-pay | Admitting: Internal Medicine

## 2022-02-15 DIAGNOSIS — M5441 Lumbago with sciatica, right side: Secondary | ICD-10-CM

## 2022-02-16 NOTE — Telephone Encounter (Signed)
Requested Prescriptions  Pending Prescriptions Disp Refills  . meloxicam (MOBIC) 15 MG tablet [Pharmacy Med Name: Meloxicam 15 MG Oral Tablet] 30 tablet 0    Sig: Take 1 tablet by mouth once daily     Analgesics:  COX2 Inhibitors Failed - 02/15/2022  3:21 PM      Failed - Manual Review: Labs are only required if the patient has taken medication for more than 8 weeks.      Passed - HGB in normal range and within 360 days    Hemoglobin  Date Value Ref Range Status  09/17/2021 12.4 11.1 - 15.9 g/dL Final         Passed - Cr in normal range and within 360 days    Creatinine, Ser  Date Value Ref Range Status  09/17/2021 0.72 0.57 - 1.00 mg/dL Final         Passed - HCT in normal range and within 360 days    Hematocrit  Date Value Ref Range Status  09/17/2021 38.3 34.0 - 46.6 % Final         Passed - AST in normal range and within 360 days    AST  Date Value Ref Range Status  09/17/2021 19 0 - 40 IU/L Final         Passed - ALT in normal range and within 360 days    ALT  Date Value Ref Range Status  09/17/2021 21 0 - 32 IU/L Final         Passed - eGFR is 30 or above and within 360 days    GFR calc Af Amer  Date Value Ref Range Status  06/08/2017 >60 >60 mL/min Final    Comment:    (NOTE) The eGFR has been calculated using the CKD EPI equation. This calculation has not been validated in all clinical situations. eGFR's persistently <60 mL/min signify possible Chronic Kidney Disease.    GFR calc non Af Amer  Date Value Ref Range Status  06/08/2017 >60 >60 mL/min Final   eGFR  Date Value Ref Range Status  09/17/2021 100 >59 mL/min/1.73 Final         Passed - Patient is not pregnant      Passed - Valid encounter within last 12 months    Recent Outpatient Visits          1 week ago Acute right-sided low back pain with right-sided sciatica   [place] [facility], Jesse Sans, MD   1 month ago Acute right-sided low  back pain with right-sided sciatica   [place] [facility], Jesse Sans, MD   2 months ago OSA (obstructive sleep apnea)   [place] [facility], Jesse Sans, MD   5 months ago Annual physical exam   [facility] at [facility], Jesse Sans, MD   6 months ago OSA (obstructive sleep apnea)   [facility] at [facility], Jesse Sans, MD      Future Appointments            In 7 months Army Melia, Jesse Sans, MD [place] [facility] at [facility], [facility]

## 2022-03-09 ENCOUNTER — Encounter: Payer: Self-pay | Admitting: Physical Therapy

## 2022-03-09 ENCOUNTER — Ambulatory Visit: Payer: BC Managed Care – PPO | Attending: Internal Medicine | Admitting: Physical Therapy

## 2022-03-09 DIAGNOSIS — M2560 Stiffness of unspecified joint, not elsewhere classified: Secondary | ICD-10-CM | POA: Insufficient documentation

## 2022-03-09 DIAGNOSIS — M5441 Lumbago with sciatica, right side: Secondary | ICD-10-CM | POA: Insufficient documentation

## 2022-03-09 DIAGNOSIS — M5459 Other low back pain: Secondary | ICD-10-CM | POA: Diagnosis present

## 2022-03-09 DIAGNOSIS — M6281 Muscle weakness (generalized): Secondary | ICD-10-CM | POA: Insufficient documentation

## 2022-03-09 NOTE — Therapy (Signed)
OUTPATIENT PHYSICAL THERAPY THORACOLUMBAR EVALUATION   Patient Name: Marilyn Robertson MRN: [id number] DOB:06-23-1970, 51 y.o., female Today's Date: 03/09/2022   PT End of Session - 03/09/22 0808     Visit Number 1    Number of Visits 9    Date for PT Re-Evaluation 04/06/22    PT Start Time 0808            0808 to 0901 (53 minutes).     Past Medical History:  Diagnosis Date   Cancer [facility])    Headache    Sleep apnea    Thyroid disorder    Past Surgical History:  Procedure Laterality Date   BREAST REDUCTION SURGERY  1996   BREAST SURGERY     COLONOSCOPY WITH PROPOFOL N/A 08/24/2017   Procedure: COLONOSCOPY WITH PROPOFOL;  Surgeon: Toledo, Benay Pike, MD;  Location: [facility] END[facility]COPY;  Service: Gastroenterology;  Laterality: N/A;   ROBOTIC ASSISTED TOTAL HYSTERECTOMY  2016   atypical endometrial hyperplasia   TOTAL ABDOMINAL HYSTERECTOMY     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, not intractable 09/14/2020   S/P total hysterectomy 07/30/2020   Pre-diabetes 07/07/2015   Major depression single episode, in partial remission ([place]) 06/29/2015   Acid reflux 06/29/2015   Carpal tunnel syndrome 06/29/2015   Hypothyroidism 06/29/2015   Paroxysmal digital cyanosis 06/29/2015   OSA (obstructive sleep apnea) 06/29/2015   Hyperlipidemia, mild 06/29/2015   History of endometrial cancer 12/27/2014    PCP: Glean Hess, MD   REFERRING PROVIDER: Glean Hess, MD   REFERRING DIAG: Acute right sided low back pain with right-sided sciatica  Rationale for Evaluation and Treatment Rehabilitation  THERAPY DIAG:  Low back pain  Muscle weakness  Joint stiffness  ONSET DATE: 12/19/21  SUBJECTIVE:                                                                                                                                                                                            SUBJECTIVE STATEMENT: Pt. Reports increase in low back pain since July.  Pt. C/o 5/10 pain in low back/ R buttocks with radicular symptoms to R calf (4/10).  0/10 pain at rest.  Pt. States heat helps.  Increase pain in morning and pt. States B hips with lock up while walking to car at end of workday.  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    PERTINENT HISTORY:  Chief Complaint: Back Pain (Lower Back pain - gluteus pain is the worst / Right Calf Pain.)   Back Pain This is  a new problem. The current episode started more than 1 month ago. The problem occurs daily. The problem is unchanged. The pain is present in the gluteal. The quality of the pain is described as aching. The pain radiates to the right thigh. The pain is mild. Pertinent negatives include no chest pain. She has tried NSAIDs, muscle relaxant and heat for the symptoms. The treatment provided no relief.   Assessment and Plan: 1. Acute right-sided low back pain with right-sided sciatica Minimal response to conservative care with Mobic and Flexeril. Continue Flexeril and heat Steroid taper PT referral - predniSONE (DELTASONE) 10 MG tablet; Take 6 on day 1and 2, 5 on day 3 and 4, 4 on day 5 and 6 , 3 on day 7 and 8, 2 on day 9 and 10 and 1 on day 11 and 12 then stop.  Dispense: 42 tablet; Refill: 0 - Ambulatory referral to Physical Therapy     Partially dictated using Dragon software. Any errors are unintentional.   Halina Maidens, MD [facility]   02/08/2022    PAIN:  Are you having pain? Yes: NPRS scale: 5/10 Pain location: low back/ R buttocks Pain description: sore/ tight Aggravating factors: increase activity Relieving factors: rest   PRECAUTIONS: None  WEIGHT BEARING RESTRICTIONS No  FALLS:  Has patient fallen in last 6 months? No  LIVING ENVIRONMENT: Lives with: lives alone Lives in: House/apartment Stairs: pt. Avoids stairs secondary to pain/ using elevator at work.     OCCUPATION: Works at [employer] [place] Decrease low back pain/ improve pain-free mobility   OBJECTIVE:   DIAGNOSTIC FINDINGS:  CLINICAL DATA:  Low back pain   EXAM: LUMBAR SPINE - COMPLETE 4+ VIEW   COMPARISON:  None Available.   FINDINGS: Lumbar vertebral body height height are preserved without evidence of fracture. Grade 1 anterolisthesis of L4 on L5. Intervertebral disc spaces are preserved. Facet arthropathy from L4-S1.   IMPRESSION: Degenerative changes of the lumbar spine as described.     Electronically Signed   By: Ofilia Neas M.D.   On: 12/25/2021 13:08  PATIENT SURVEYS:  FOTO initial 49/ goal 61  SCREENING FOR RED FLAGS: Bowel or bladder incontinence: No Spinal tumors: No Cauda equina syndrome: No Compression fracture: No Abdominal aneurysm: No  COGNITION:  Overall cognitive status: Within functional limits for tasks assessed     SENSATION: WFL  MUSCLE LENGTH: Hamstrings: Right WFL; Left WFL Thomas test: NT  POSTURE: Slight rounded shoulders  PALPATION: Tenderness in R piriformis/ L3-5 spinous processes/ R gastroc.    LUMBAR ROM:   Lumbar AROM WNL.  No increase pain with standing lumbar flexion.  Lumbar rotn. "Feels good"  LOWER EXTREMITY ROM:     B LE AROM WNL  LOWER EXTREMITY MMT:      B LE muscle strength grossly 5/5 MMT except R hip flexion 4/5 MMT (slight pain).     Increase R LBP with prone L hip extension.    LUMBAR SPECIAL TESTS:  Straight leg raise test: Negative, FABER test: Negative, and Gaenslen's test: Negative  GAIT: Distance walked: in clinic Assistive device utilized: None Level of assistance: Complete Independence Comments: Slight increase in low back pain with increase distance walked.  Recip. Gait pattern    TODAY'S TREATMENT  Evaluation/ issued HEP   PATIENT EDUCATION:  Education details: Access Code: [email or web address] Person educated: Patient Education method:  Explanation, Demonstration, and Handouts Education comprehension: verbalized understanding and returned demonstration  HOME EXERCISE PROGRAM: Access Code: [email or web address] URL: [email or web address] Date: 03/09/2022 Prepared by: Dorcas Carrow   Exercises - Hooklying Transversus Abdominis Palpation  - 2 x daily - 7 x weekly - 1 sets - 10 reps - Supine Lower Trunk Rotation  - 2 x daily - 7 x weekly - 1 sets - 3 reps - Supine Figure 4 Piriformis Stretch  - 2 x daily - 7 x weekly - 1 sets - 3 reps - Supine Piriformis Stretch with Leg Straight  - 2 x daily - 7 x weekly - 1 sets - 3 reps - 10 seconds hold - Supine Sciatic Nerve Glide  - 2 x daily - 7 x weekly - 1 sets - 3 reps - 10 seconds hold - Seated Sciatic Tensioner  - 2 x daily - 7 x weekly - 1 sets - 3 reps - 10 seconds hold  ASSESSMENT:  CLINICAL IMPRESSION: Patient is a pleasant 51 y.o. female who was seen today for physical therapy evaluation and treatment for low back pain with R sciatica/ radicular symptoms into R calf.  Pt. Reports 5/10 pain currently at rest with moderate tenderness/ discomfort in R calf.  Pt. Presents with good lumbar/ LE AROM and strength.  Pt. Has core muscle/ R hip weakness and pain with increase activity/ walking.  Pt. Will benefit from skilled PT services to increase core strengthening and pain-free mobility to promote return to prior level of function.     OBJECTIVE IMPAIRMENTS decreased endurance, decreased mobility, difficulty walking, decreased ROM, decreased strength, impaired flexibility, improper body mechanics, obesity, and pain.   ACTIVITY LIMITATIONS carrying, lifting, bending, sitting, standing, squatting, sleeping, stairs, transfers, locomotion level, and caring for others  PARTICIPATION LIMITATIONS: occupation  [place] and Past/current experiences are also affecting patient's functional outcome.   REHAB POTENTIAL: Good  CLINICAL DECISION MAKING:  Stable/uncomplicated  EVALUATION COMPLEXITY: Low   GOALS: Goals reviewed with patient? Yes  SHORT TERM GOALS: Target date: 03/23/22  Pt. Independent with HEP to increase core/ hip strength to improve pain-free mobility.  Baseline:  see above Goal status: INITIAL   LONG TERM GOALS: Target date: 04/06/22  Pt. Will increase FOTO to 61 to improve pain-free functional mobility.   Baseline:  initial FOTO: 49 Goal status: INITIAL  2.  Pt. Will reports no tenderness/ radicular symptoms into R calf to improve pain-free mobility.   Baseline: (+) R calf tenderness/ pain Goal status: INITIAL  3.  Pt. Able to walk to car at end of work-day with no c/o low back/ hip pain.   Baseline: pain limited at end of day Goal status: INITIAL   PLAN: PT FREQUENCY: 2x/week  PT DURATION: 4 weeks  PLANNED INTERVENTIONS: Therapeutic exercises, Therapeutic activity, Neuromuscular re-education, Balance training, Gait training, Patient/Family education, Self Care, Joint mobilization, and Manual therapy.  PLAN FOR NEXT SESSION: Progress TrA ex.  Pura Spice, PT, DPT # [id number] 03/09/2022, 8:08 AM

## 2022-03-09 NOTE — Patient Instructions (Signed)
Access Code: [email or web address] URL: [email or web address] Date: 03/09/2022 Prepared by: Dorcas Carrow  Exercises - Hooklying Transversus Abdominis Palpation  - 2 x daily - 7 x weekly - 1 sets - 10 reps - Supine Lower Trunk Rotation  - 2 x daily - 7 x weekly - 1 sets - 3 reps - Supine Figure 4 Piriformis Stretch  - 2 x daily - 7 x weekly - 1 sets - 3 reps - Supine Piriformis Stretch with Leg Straight  - 2 x daily - 7 x weekly - 1 sets - 3 reps - 10 seconds hold - Supine Sciatic Nerve Glide  - 2 x daily - 7 x weekly - 1 sets - 3 reps - 10 seconds hold - Seated Sciatic Tensioner  - 2 x daily - 7 x weekly - 1 sets - 3 reps - 10 seconds hold

## 2022-03-11 ENCOUNTER — Ambulatory Visit: Payer: BC Managed Care – PPO | Admitting: Physical Therapy

## 2022-03-11 DIAGNOSIS — M2560 Stiffness of unspecified joint, not elsewhere classified: Secondary | ICD-10-CM

## 2022-03-11 DIAGNOSIS — M6281 Muscle weakness (generalized): Secondary | ICD-10-CM

## 2022-03-11 DIAGNOSIS — M5459 Other low back pain: Secondary | ICD-10-CM

## 2022-03-13 NOTE — Therapy (Signed)
OUTPATIENT PHYSICAL THERAPY THORACOLUMBAR TREATMENT   Patient Name: Marilyn Robertson MRN: [id number] DOB:[birth year], 51 y.o., female Today's Date: 03/11/22   PT End of Session - 03/13/22 1408     Visit Number 2    Number of Visits 9    Date for PT Re-Evaluation 04/06/22    PT Start Time 0817    PT Stop Time 0901    PT Time Calculation (min) 44 min              Past Medical History:  Diagnosis Date   Cancer ([place])    Headache    Sleep apnea    Thyroid disorder    Past Surgical History:  Procedure Laterality Date   BREAST REDUCTION SURGERY  1996   BREAST SURGERY     COLONOSCOPY WITH PROPOFOL N/A 08/24/2017   Procedure: COLONOSCOPY WITH PROPOFOL;  Surgeon: Toledo, Benay Pike, MD;  Location: [facility] ENDOSCOPY;  Service: Gastroenterology;  Laterality: N/A;   ROBOTIC ASSISTED TOTAL HYSTERECTOMY  2016   atypical endometrial hyperplasia   TOTAL ABDOMINAL HYSTERECTOMY     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, not intractable 09/14/2020   S/P total hysterectomy 07/30/2020   Pre-diabetes 07/07/2015   Major depression single episode, in partial remission ([place]) 06/29/2015   Acid reflux 06/29/2015   Carpal tunnel syndrome 06/29/2015   Hypothyroidism 06/29/2015   Paroxysmal digital cyanosis 06/29/2015   OSA (obstructive sleep apnea) 06/29/2015   Hyperlipidemia, mild 06/29/2015   History of endometrial cancer 12/27/2014    PCP: Glean Hess, MD   REFERRING PROVIDER: Glean Hess, MD   REFERRING DIAG: Acute right sided low back pain with right-sided sciatica  Rationale for Evaluation and Treatment Rehabilitation  THERAPY DIAG:  Low back pain  Muscle weakness  Joint stiffness  ONSET DATE: 12/19/21  SUBJECTIVE:                                                                                                                                                                                            SUBJECTIVE STATEMENT:  EVALUATION   03/09/22 Pt. Reports increase in low back pain since July.  Pt. C/o 5/10 pain in low back/ R buttocks with radicular symptoms to R calf (4/10).  0/10 pain at rest.  Pt. States heat helps.  Increase pain in morning and pt. States B hips with lock up while walking to car at end of workday.  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    PERTINENT HISTORY:  Chief Complaint: Back Pain (Lower Back pain - gluteus  pain is the worst / Right Calf Pain.)   Back Pain This is a new problem. The current episode started more than 1 month ago. The problem occurs daily. The problem is unchanged. The pain is present in the gluteal. The quality of the pain is described as aching. The pain radiates to the right thigh. The pain is mild. Pertinent negatives include no chest pain. She has tried NSAIDs, muscle relaxant and heat for the symptoms. The treatment provided no relief.   Assessment and Plan: 1. Acute right-sided low back pain with right-sided sciatica Minimal response to conservative care with Mobic and Flexeril. Continue Flexeril and heat Steroid taper PT referral - predniSONE (DELTASONE) 10 MG tablet; Take 6 on day 1and 2, 5 on day 3 and 4, 4 on day 5 and 6 , 3 on day 7 and 8, 2 on day 9 and 10 and 1 on day 11 and 12 then stop.  Dispense: 42 tablet; Refill: 0 - Ambulatory referral to Physical Therapy     Partially dictated using Dragon software. Any errors are unintentional.   Halina Maidens, MD [facility]   02/08/2022    PAIN:  Are you having pain? Yes: NPRS scale: 5/10 Pain location: low back/ R buttocks Pain description: sore/ tight Aggravating factors: increase activity Relieving factors: rest   PRECAUTIONS: None  WEIGHT BEARING RESTRICTIONS No  FALLS:  Has patient fallen in last 6 months? No  LIVING ENVIRONMENT: Lives with: lives alone Lives in: House/apartment Stairs: pt. Avoids stairs  secondary to pain/ using elevator at work.    OCCUPATION: Works at [employer]: [employer] Decrease low back pain/ improve pain-free mobility   OBJECTIVE:   DIAGNOSTIC FINDINGS:  CLINICAL DATA:  Low back pain   EXAM: LUMBAR SPINE - COMPLETE 4+ VIEW   COMPARISON:  None Available.   FINDINGS: Lumbar vertebral body height height are preserved without evidence of fracture. Grade 1 anterolisthesis of L4 on L5. Intervertebral disc spaces are preserved. Facet arthropathy from L4-S1.   IMPRESSION: Degenerative changes of the lumbar spine as described.     Electronically Signed   By: Ofilia Neas M.D.   On: 12/25/2021 13:08  PATIENT SURVEYS:  FOTO initial 49/ goal 61  SCREENING FOR RED FLAGS: Bowel or bladder incontinence: No Spinal tumors: No Cauda equina syndrome: No Compression fracture: No Abdominal aneurysm: No  COGNITION:  Overall cognitive status: Within functional limits for tasks assessed     SENSATION: WFL  MUSCLE LENGTH: Hamstrings: Right WFL; Left WFL Thomas test: NT  POSTURE: Slight rounded shoulders  PALPATION: Tenderness in R piriformis/ L3-5 spinous processes/ R gastroc.    LUMBAR ROM:   Lumbar AROM WNL.  No increase pain with standing lumbar flexion.  Lumbar rotn. "Feels good"  LOWER EXTREMITY ROM:     B LE AROM WNL  LOWER EXTREMITY MMT:      B LE muscle strength grossly 5/5 MMT except R hip flexion 4/5 MMT (slight pain).     Increase R LBP with prone L hip extension.    LUMBAR SPECIAL TESTS:  Straight leg raise test: Negative, FABER test: Negative, and Gaenslen's test: Negative  GAIT: Distance walked: in clinic Assistive device utilized: None Level of assistance: Complete Independence Comments: Slight increase in low back pain with increase distance walked.  Recip. Gait pattern    TODAY'S TREATMENT   03/11/22  Subjective:  Pt. Reports no new complaints.  Pt. States she is doing  HEP.     There.ex.:  Reviewed HEP/ issued page #1-2 of Lumbar Stabilization ex. Program  Manual tx.:  Supine LE/lumbar stretches (generalized) with static holds.  Prone STM to low thoracic/lumbar/superior glut musculature (use of Hypervolt).     PATIENT EDUCATION:  Education details: Access Code: [email or web address] Person educated: Patient Education method: Explanation, Demonstration, and Handouts Education comprehension: verbalized understanding and returned demonstration   HOME EXERCISE PROGRAM: Access Code: [email or web address] URL: [email or web address] Date: 03/09/2022 Prepared by: Dorcas Carrow   Exercises - Hooklying Transversus Abdominis Palpation  - 2 x daily - 7 x weekly - 1 sets - 10 reps - Supine Lower Trunk Rotation  - 2 x daily - 7 x weekly - 1 sets - 3 reps - Supine Figure 4 Piriformis Stretch  - 2 x daily - 7 x weekly - 1 sets - 3 reps - Supine Piriformis Stretch with Leg Straight  - 2 x daily - 7 x weekly - 1 sets - 3 reps - 10 seconds hold - Supine Sciatic Nerve Glide  - 2 x daily - 7 x weekly - 1 sets - 3 reps - 10 seconds hold - Seated Sciatic Tensioner  - 2 x daily - 7 x weekly - 1 sets - 3 reps - 10 seconds hold  ASSESSMENT:  CLINICAL IMPRESSION: Patient understands TrA muscle activation and progressing with program (limited at bridging). Good tx. Tolerance with STM to lumbar musculature in prone position.  Good lumbar rotn. L/R.  Pt. Will benefit from skilled PT services to increase core strengthening and pain-free mobility to promote return to prior level of function.     OBJECTIVE IMPAIRMENTS decreased endurance, decreased mobility, difficulty walking, decreased ROM, decreased strength, impaired flexibility, improper body mechanics, obesity, and pain.   ACTIVITY LIMITATIONS carrying, lifting, bending, sitting, standing, squatting, sleeping, stairs, transfers, locomotion level, and caring for others  PARTICIPATION LIMITATIONS: occupation  [place] and Past/current experiences are also affecting patient's functional outcome.   REHAB POTENTIAL: Good  CLINICAL DECISION MAKING: Stable/uncomplicated  EVALUATION COMPLEXITY: Low   GOALS: Goals reviewed with patient? Yes  SHORT TERM GOALS: Target date: 03/23/22  Pt. Independent with HEP to increase core/ hip strength to improve pain-free mobility.  Baseline:  see above Goal status: INITIAL   LONG TERM GOALS: Target date: 04/06/22  Pt. Will increase FOTO to 61 to improve pain-free functional mobility.   Baseline:  initial FOTO: 49 Goal status: INITIAL  2.  Pt. Will reports no tenderness/ radicular symptoms into R calf to improve pain-free mobility.   Baseline: (+) R calf tenderness/ pain Goal status: INITIAL  3.  Pt. Able to walk to car at end of work-day with no c/o low back/ hip pain.   Baseline: pain limited at end of day Goal status: INITIAL   PLAN: PT FREQUENCY: 2x/week  PT DURATION: 4 weeks  PLANNED INTERVENTIONS: Therapeutic exercises, Therapeutic activity, Neuromuscular re-education, Balance training, Gait training, Patient/Family education, Self Care, Joint mobilization, and Manual therapy.  PLAN FOR NEXT SESSION: Progress TrA ex.  Pura Spice, PT, DPT # [phone] 03/13/2022, 2:09 PM

## 2022-03-16 ENCOUNTER — Ambulatory Visit: Payer: BC Managed Care – PPO | Admitting: Physical Therapy

## 2022-03-16 ENCOUNTER — Encounter: Payer: Self-pay | Admitting: Physical Therapy

## 2022-03-16 DIAGNOSIS — M5459 Other low back pain: Secondary | ICD-10-CM

## 2022-03-16 DIAGNOSIS — M6281 Muscle weakness (generalized): Secondary | ICD-10-CM

## 2022-03-16 DIAGNOSIS — M2560 Stiffness of unspecified joint, not elsewhere classified: Secondary | ICD-10-CM

## 2022-03-16 NOTE — Therapy (Signed)
OUTPATIENT PHYSICAL THERAPY THORACOLUMBAR TREATMENT   Patient Name: Marilyn Robertson MRN: [id number] DOB:10-18-1970, 51 y.o., female Today's Date: 03/16/22   PT End of Session - 03/16/22 0817     Visit Number 3    Number of Visits 9    Date for PT Re-Evaluation 04/06/22    PT Start Time 0813    PT Stop Time 0901    PT Time Calculation (min) 48 min              Past Medical History:  Diagnosis Date   Cancer ([place])    Headache    Sleep apnea    Thyroid disorder    Past Surgical History:  Procedure Laterality Date   BREAST REDUCTION SURGERY  1996   BREAST SURGERY     COLONOSCOPY WITH PROPOFOL N/A 08/24/2017   Procedure: COLONOSCOPY WITH PROPOFOL;  Surgeon: Toledo, Benay Pike, MD;  Location: [facility] ENDOSCOPY;  Service: Gastroenterology;  Laterality: N/A;   ROBOTIC ASSISTED TOTAL HYSTERECTOMY  2016   atypical endometrial hyperplasia   TOTAL ABDOMINAL HYSTERECTOMY     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, not intractable 09/14/2020   S/P total hysterectomy 07/30/2020   Pre-diabetes 07/07/2015   Major depression single episode, in partial remission ([place]) 06/29/2015   Acid reflux 06/29/2015   Carpal tunnel syndrome 06/29/2015   Hypothyroidism 06/29/2015   Paroxysmal digital cyanosis 06/29/2015   OSA (obstructive sleep apnea) 06/29/2015   Hyperlipidemia, mild 06/29/2015   History of endometrial cancer 12/27/2014    PCP: Glean Hess, MD   REFERRING PROVIDER: Glean Hess, MD   REFERRING DIAG: Acute right sided low back pain with right-sided sciatica  Rationale for Evaluation and Treatment Rehabilitation  THERAPY DIAG:  Low back pain  Muscle weakness  Joint stiffness  ONSET DATE: 12/19/21  SUBJECTIVE:                                                                                                                                                                                            SUBJECTIVE STATEMENT:  EVALUATION   03/09/22 Pt. Reports increase in low back pain since July.  Pt. C/o 5/10 pain in low back/ R buttocks with radicular symptoms to R calf (4/10).  0/10 pain at rest.  Pt. States heat helps.  Increase pain in morning and pt. States B hips with lock up while walking to car at end of workday.  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    PERTINENT HISTORY:  Chief Complaint: Back Pain (Lower Back pain - gluteus  pain is the worst / Right Calf Pain.)   Back Pain This is a new problem. The current episode started more than 1 month ago. The problem occurs daily. The problem is unchanged. The pain is present in the gluteal. The quality of the pain is described as aching. The pain radiates to the right thigh. The pain is mild. Pertinent negatives include no chest pain. She has tried NSAIDs, muscle relaxant and heat for the symptoms. The treatment provided no relief.   Assessment and Plan: 1. Acute right-sided low back pain with right-sided sciatica Minimal response to conservative care with Mobic and Flexeril. Continue Flexeril and heat Steroid taper PT referral - predniSONE (DELTASONE) 10 MG tablet; Take 6 on day 1and 2, 5 on day 3 and 4, 4 on day 5 and 6 , 3 on day 7 and 8, 2 on day 9 and 10 and 1 on day 11 and 12 then stop.  Dispense: 42 tablet; Refill: 0 - Ambulatory referral to Physical Therapy     Partially dictated using Dragon software. Any errors are unintentional.   Halina Maidens, MD [facility]   02/08/2022    PAIN:  Are you having pain? Yes: NPRS scale: 5/10 Pain location: low back/ R buttocks Pain description: sore/ tight Aggravating factors: increase activity Relieving factors: rest   PRECAUTIONS: None  WEIGHT BEARING RESTRICTIONS No  FALLS:  Has patient fallen in last 6 months? No  LIVING ENVIRONMENT: Lives with: lives alone Lives in: House/apartment Stairs: pt. Avoids stairs  secondary to pain/ using elevator at work.    OCCUPATION: Works at [employer] Decrease low back pain/ improve pain-free mobility   OBJECTIVE:   DIAGNOSTIC FINDINGS:  CLINICAL DATA:  Low back pain   EXAM: LUMBAR SPINE - COMPLETE 4+ VIEW   COMPARISON:  None Available.   FINDINGS: Lumbar vertebral body height height are preserved without evidence of fracture. Grade 1 anterolisthesis of L4 on L5. Intervertebral disc spaces are preserved. Facet arthropathy from L4-S1.   IMPRESSION: Degenerative changes of the lumbar spine as described.     Electronically Signed   By: Ofilia Neas M.D.   On: 12/25/2021 13:08  PATIENT SURVEYS:  FOTO initial 49/ goal 61  SCREENING FOR RED FLAGS: Bowel or bladder incontinence: No Spinal tumors: No Cauda equina syndrome: No Compression fracture: No Abdominal aneurysm: No  COGNITION:  Overall cognitive status: Within functional limits for tasks assessed     SENSATION: WFL  MUSCLE LENGTH: Hamstrings: Right WFL; Left WFL Thomas test: NT  POSTURE: Slight rounded shoulders  PALPATION: Tenderness in R piriformis/ L3-5 spinous processes/ R gastroc.    LUMBAR ROM:   Lumbar AROM WNL.  No increase pain with standing lumbar flexion.  Lumbar rotn. "Feels good"  LOWER EXTREMITY ROM:     B LE AROM WNL  LOWER EXTREMITY MMT:      B LE muscle strength grossly 5/5 MMT except R hip flexion 4/5 MMT (slight pain).     Increase R LBP with prone L hip extension.    LUMBAR SPECIAL TESTS:  Straight leg raise test: Negative, FABER test: Negative, and Gaenslen's test: Negative  GAIT: Distance walked: in clinic Assistive device utilized: None Level of assistance: Complete Independence Comments: Slight increase in low back pain with increase distance walked.  Recip. Gait pattern    TODAY'S TREATMENT   03/16/22  Subjective:  Pt. Reports 5/10 R LE today.  Pt. States she is  doing HEP and bridging has  been too bad.     There.ex.:   Scifit L4 10 min. B UE/LE.  "[place]" in R low back with walking after Nustep.    Reviewed page #1-2 of Lumbar Stabilization ex. Program.  Added GTB hip abduction 20x.    Manual tx.:  Supine LE/lumbar stretches (generalized) with static holds.  Prone STM to low thoracic/lumbar/superior glut musculature (use of Hypervolt).     PATIENT EDUCATION:  Education details: Access Code: [email or web address] Person educated: Patient Education method: Explanation, Demonstration, and Handouts Education comprehension: verbalized understanding and returned demonstration   HOME EXERCISE PROGRAM: Access Code: [email or web address] URL: [email or web address] Date: 03/09/2022 Prepared by: Dorcas Carrow   Exercises - Hooklying Transversus Abdominis Palpation  - 2 x daily - 7 x weekly - 1 sets - 10 reps - Supine Lower Trunk Rotation  - 2 x daily - 7 x weekly - 1 sets - 3 reps - Supine Figure 4 Piriformis Stretch  - 2 x daily - 7 x weekly - 1 sets - 3 reps - Supine Piriformis Stretch with Leg Straight  - 2 x daily - 7 x weekly - 1 sets - 3 reps - 10 seconds hold - Supine Sciatic Nerve Glide  - 2 x daily - 7 x weekly - 1 sets - 3 reps - 10 seconds hold - Seated Sciatic Tensioner  - 2 x daily - 7 x weekly - 1 sets - 3 reps - 10 seconds hold  ASSESSMENT:  CLINICAL IMPRESSION: Patient understands TrA muscle activation and progressing with program (limited at bridging). Good tx. Tolerance with STM to lumbar musculature in prone position.  Good lumbar rotn. L/R.  Pt. Will benefit from skilled PT services to increase core strengthening and pain-free mobility to promote return to prior level of function.     OBJECTIVE IMPAIRMENTS decreased endurance, decreased mobility, difficulty walking, decreased ROM, decreased strength, impaired flexibility, improper body mechanics, obesity, and pain.   ACTIVITY LIMITATIONS carrying, lifting, bending, sitting, standing, squatting, sleeping,  stairs, transfers, locomotion level, and caring for others  PARTICIPATION LIMITATIONS: occupation  [place] and Past/current experiences are also affecting patient's functional outcome.   REHAB POTENTIAL: Good  CLINICAL DECISION MAKING: Stable/uncomplicated  EVALUATION COMPLEXITY: Low   GOALS: Goals reviewed with patient? Yes  SHORT TERM GOALS: Target date: 03/23/22  Pt. Independent with HEP to increase core/ hip strength to improve pain-free mobility.  Baseline:  see above Goal status: INITIAL   LONG TERM GOALS: Target date: 04/06/22  Pt. Will increase FOTO to 61 to improve pain-free functional mobility.   Baseline:  initial FOTO: 49 Goal status: INITIAL  2.  Pt. Will reports no tenderness/ radicular symptoms into R calf to improve pain-free mobility.   Baseline: (+) R calf tenderness/ pain Goal status: INITIAL  3.  Pt. Able to walk to car at end of work-day with no c/o low back/ hip pain.   Baseline: pain limited at end of day Goal status: INITIAL   PLAN: PT FREQUENCY: 2x/week  PT DURATION: 4 weeks  PLANNED INTERVENTIONS: Therapeutic exercises, Therapeutic activity, Neuromuscular re-education, Balance training, Gait training, Patient/Family education, Self Care, Joint mobilization, and Manual therapy.  PLAN FOR NEXT SESSION: Progress TrA ex.  Pura Spice, PT, DPT # [phone] 03/16/2022, 7:57 PM

## 2022-03-18 ENCOUNTER — Ambulatory Visit: Payer: BC Managed Care – PPO | Admitting: Physical Therapy

## 2022-03-18 DIAGNOSIS — M6281 Muscle weakness (generalized): Secondary | ICD-10-CM

## 2022-03-18 DIAGNOSIS — M5459 Other low back pain: Secondary | ICD-10-CM | POA: Diagnosis not present

## 2022-03-18 DIAGNOSIS — M2560 Stiffness of unspecified joint, not elsewhere classified: Secondary | ICD-10-CM

## 2022-03-18 NOTE — Therapy (Signed)
OUTPATIENT PHYSICAL THERAPY THORACOLUMBAR TREATMENT   Patient Name: Marilyn Robertson MRN: [id number] DOB:02-Feb-1971, 51 y.o., female Today's Date: 03/18/22   PT End of Session - 03/18/22 0817     Visit Number 4    Number of Visits 9    Date for PT Re-Evaluation 04/06/22    PT Start Time 6629             0817 to 0859   (42 minutes)   Past Medical History:  Diagnosis Date   Cancer ([place])    Headache    Sleep apnea    Thyroid disorder    Past Surgical History:  Procedure Laterality Date   BREAST REDUCTION SURGERY  1996   BREAST SURGERY     COLONOSCOPY WITH PROPOFOL N/A 08/24/2017   Procedure: COLONOSCOPY WITH PROPOFOL;  Surgeon: Toledo, Benay Pike, MD;  Location: [facility] ENDOSCOPY;  Service: Gastroenterology;  Laterality: N/A;   ROBOTIC ASSISTED TOTAL HYSTERECTOMY  2016   atypical endometrial hyperplasia   TOTAL ABDOMINAL HYSTERECTOMY     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, not intractable 09/14/2020   S/P total hysterectomy 07/30/2020   Pre-diabetes 07/07/2015   Major depression single episode, in partial remission ([place]) 06/29/2015   Acid reflux 06/29/2015   Carpal tunnel syndrome 06/29/2015   Hypothyroidism 06/29/2015   Paroxysmal digital cyanosis 06/29/2015   OSA (obstructive sleep apnea) 06/29/2015   Hyperlipidemia, mild 06/29/2015   History of endometrial cancer 12/27/2014    PCP: Glean Hess, MD   REFERRING PROVIDER: Glean Hess, MD   REFERRING DIAG: Acute right sided low back pain with right-sided sciatica  Rationale for Evaluation and Treatment Rehabilitation  THERAPY DIAG:  Low back pain  Muscle weakness  Joint stiffness  ONSET DATE: 12/19/21  SUBJECTIVE:                                                                                                                                                                                            SUBJECTIVE STATEMENT:  EVALUATION   03/09/22 Pt. Reports increase in low back pain since July.  Pt. C/o 5/10 pain in low back/ R buttocks with radicular symptoms to R calf (4/10).  0/10 pain at rest.  Pt. States heat helps.  Increase pain in morning and pt. States B hips with lock up while walking to car at end of workday.  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    PERTINENT HISTORY:  Chief Complaint: Back Pain (Lower Back pain - gluteus pain is the worst / Right Calf Pain.)  Back Pain This is a new problem. The current episode started more than 1 month ago. The problem occurs daily. The problem is unchanged. The pain is present in the gluteal. The quality of the pain is described as aching. The pain radiates to the right thigh. The pain is mild. Pertinent negatives include no chest pain. She has tried NSAIDs, muscle relaxant and heat for the symptoms. The treatment provided no relief.   Assessment and Plan: 1. Acute right-sided low back pain with right-sided sciatica Minimal response to conservative care with Mobic and Flexeril. Continue Flexeril and heat Steroid taper PT referral - predniSONE (DELTASONE) 10 MG tablet; Take 6 on day 1and 2, 5 on day 3 and 4, 4 on day 5 and 6 , 3 on day 7 and 8, 2 on day 9 and 10 and 1 on day 11 and 12 then stop.  Dispense: 42 tablet; Refill: 0 - Ambulatory referral to Physical Therapy     Partially dictated using Dragon software. Any errors are unintentional.   Halina Maidens, MD [facility]   02/08/2022    PAIN:  Are you having pain? Yes: NPRS scale: 5/10 Pain location: low back/ R buttocks Pain description: sore/ tight Aggravating factors: increase activity Relieving factors: rest   PRECAUTIONS: None  WEIGHT BEARING RESTRICTIONS No  FALLS:  Has patient fallen in last 6 months? No  LIVING ENVIRONMENT: Lives with: lives alone Lives in: House/apartment Stairs: pt. Avoids stairs secondary to pain/ using  elevator at work.    OCCUPATION: Works at [employer] Decrease low back pain/ improve pain-free mobility   OBJECTIVE:   DIAGNOSTIC FINDINGS:  CLINICAL DATA:  Low back pain   EXAM: LUMBAR SPINE - COMPLETE 4+ VIEW   COMPARISON:  None Available.   FINDINGS: Lumbar vertebral body height height are preserved without evidence of fracture. Grade 1 anterolisthesis of L4 on L5. Intervertebral disc spaces are preserved. Facet arthropathy from L4-S1.   IMPRESSION: Degenerative changes of the lumbar spine as described.     Electronically Signed   By: Ofilia Neas M.D.   On: 12/25/2021 13:08  PATIENT SURVEYS:  FOTO initial 49/ goal 61  SCREENING FOR RED FLAGS: Bowel or bladder incontinence: No Spinal tumors: No Cauda equina syndrome: No Compression fracture: No Abdominal aneurysm: No  COGNITION:  Overall cognitive status: Within functional limits for tasks assessed     SENSATION: WFL  MUSCLE LENGTH: Hamstrings: Right WFL; Left WFL Thomas test: NT  POSTURE: Slight rounded shoulders  PALPATION: Tenderness in R piriformis/ L3-5 spinous processes/ R gastroc.    LUMBAR ROM:   Lumbar AROM WNL.  No increase pain with standing lumbar flexion.  Lumbar rotn. "Feels good"  LOWER EXTREMITY ROM:     B LE AROM WNL  LOWER EXTREMITY MMT:      B LE muscle strength grossly 5/5 MMT except R hip flexion 4/5 MMT (slight pain).     Increase R LBP with prone L hip extension.    LUMBAR SPECIAL TESTS:  Straight leg raise test: Negative, FABER test: Negative, and Gaenslen's test: Negative  GAIT: Distance walked: in clinic Assistive device utilized: None Level of assistance: Complete Independence Comments: Slight increase in low back pain with increase distance walked.  Recip. Gait pattern    TODAY'S TREATMENT   03/18/22  Subjective:  Pt. Reports 6/10 R calf discomfort while on Nustep. No questions about HEP.    There.ex.:   Nustep  L4 10 min. B UE/LE.  Discussed upcoming weekend.    Walking in clinic for gait reassessment and ascending/ descending stairs.    Reviewed supine/ seated lumbar Stabilization ex. Program.  Discussed aquatic ex.  Seated blue ball ex.:  TrA activation/ pelvic tilts (all planes)/ marching/ LAQ/ alt. UE/LE ex. With light assist from //-bars as needed.   Manual tx.:  Supine LE/lumbar stretches (generalized) with static holds.  Prone STM to low thoracic/lumbar/superior glut musculature (use of Hypervolt).  MH to varying locations of low back.     PATIENT EDUCATION:  Education details: Access Code: [email or web address] Person educated: Patient Education method: Explanation, Demonstration, and Handouts Education comprehension: verbalized understanding and returned demonstration   HOME EXERCISE PROGRAM: Access Code: [email or web address] URL: [email or web address] Date: 03/09/2022 Prepared by: Dorcas Carrow   Exercises - Hooklying Transversus Abdominis Palpation  - 2 x daily - 7 x weekly - 1 sets - 10 reps - Supine Lower Trunk Rotation  - 2 x daily - 7 x weekly - 1 sets - 3 reps - Supine Figure 4 Piriformis Stretch  - 2 x daily - 7 x weekly - 1 sets - 3 reps - Supine Piriformis Stretch with Leg Straight  - 2 x daily - 7 x weekly - 1 sets - 3 reps - 10 seconds hold - Supine Sciatic Nerve Glide  - 2 x daily - 7 x weekly - 1 sets - 3 reps - 10 seconds hold - Seated Sciatic Tensioner  - 2 x daily - 7 x weekly - 1 sets - 3 reps - 10 seconds hold  ASSESSMENT:  CLINICAL IMPRESSION: Patient understands TrA muscle activation and progressing with seated blue ball ex. At //-bars/ [employer]. Good tx. Tolerance with STM to lumbar musculature in prone position.  Good lumbar rotn. L/R.  Pt. Will benefit from skilled PT services to increase core strengthening and pain-free mobility to promote return to prior level of function.     OBJECTIVE IMPAIRMENTS decreased endurance, decreased mobility, difficulty  walking, decreased ROM, decreased strength, impaired flexibility, improper body mechanics, obesity, and pain.   ACTIVITY LIMITATIONS carrying, lifting, bending, sitting, standing, squatting, sleeping, stairs, transfers, locomotion level, and caring for others  PARTICIPATION LIMITATIONS: occupation  [place] and Past/current experiences are also affecting patient's functional outcome.   REHAB POTENTIAL: Good  CLINICAL DECISION MAKING: Stable/uncomplicated  EVALUATION COMPLEXITY: Low   GOALS: Goals reviewed with patient? Yes  SHORT TERM GOALS: Target date: 03/23/22  Pt. Independent with HEP to increase core/ hip strength to improve pain-free mobility.  Baseline:  see above Goal status: INITIAL   LONG TERM GOALS: Target date: 04/06/22  Pt. Will increase FOTO to 61 to improve pain-free functional mobility.   Baseline:  initial FOTO: 49 Goal status: INITIAL  2.  Pt. Will reports no tenderness/ radicular symptoms into R calf to improve pain-free mobility.   Baseline: (+) R calf tenderness/ pain Goal status: INITIAL  3.  Pt. Able to walk to car at end of work-day with no c/o low back/ hip pain.   Baseline: pain limited at end of day Goal status: INITIAL   PLAN: PT FREQUENCY: 2x/week  PT DURATION: 4 weeks  PLANNED INTERVENTIONS: Therapeutic exercises, Therapeutic activity, Neuromuscular re-education, Balance training, Gait training, Patient/Family education, Self Care, Joint mobilization, and Manual therapy.  PLAN FOR NEXT SESSION: Progress TrA ex.  Pura Spice, PT, DPT # [id number] 03/18/2022, 8:17 AM

## 2022-03-23 ENCOUNTER — Ambulatory Visit: Payer: BC Managed Care – PPO | Attending: Internal Medicine | Admitting: Physical Therapy

## 2022-03-23 DIAGNOSIS — M2560 Stiffness of unspecified joint, not elsewhere classified: Secondary | ICD-10-CM | POA: Insufficient documentation

## 2022-03-23 DIAGNOSIS — M6281 Muscle weakness (generalized): Secondary | ICD-10-CM | POA: Insufficient documentation

## 2022-03-23 DIAGNOSIS — M5459 Other low back pain: Secondary | ICD-10-CM | POA: Insufficient documentation

## 2022-03-25 ENCOUNTER — Ambulatory Visit: Payer: BC Managed Care – PPO | Admitting: Physical Therapy

## 2022-03-25 DIAGNOSIS — M5459 Other low back pain: Secondary | ICD-10-CM

## 2022-03-25 DIAGNOSIS — M2560 Stiffness of unspecified joint, not elsewhere classified: Secondary | ICD-10-CM | POA: Diagnosis present

## 2022-03-25 DIAGNOSIS — M6281 Muscle weakness (generalized): Secondary | ICD-10-CM

## 2022-03-25 NOTE — Therapy (Signed)
OUTPATIENT PHYSICAL THERAPY THORACOLUMBAR TREATMENT   Patient Name: Marilyn Robertson MRN: [id number] DOB:[birth year], 51 y.o., female Today's Date: 03/25/22   PT End of Session - 03/27/22 1109     Visit Number 5    Number of Visits 9    Date for PT Re-Evaluation 04/06/22    PT Start Time 0816    PT Stop Time 0901    PT Time Calculation (min) 45 min              Past Medical History:  Diagnosis Date   Cancer ([place])    Headache    Sleep apnea    Thyroid disorder    Past Surgical History:  Procedure Laterality Date   BREAST REDUCTION SURGERY  1996   BREAST SURGERY     COLONOSCOPY WITH PROPOFOL N/A 08/24/2017   Procedure: COLONOSCOPY WITH PROPOFOL;  Surgeon: Toledo, Benay Pike, MD;  Location: [facility] ENDOSCOPY;  Service: Gastroenterology;  Laterality: N/A;   ROBOTIC ASSISTED TOTAL HYSTERECTOMY  2016   atypical endometrial hyperplasia   TOTAL ABDOMINAL HYSTERECTOMY     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, not intractable 09/14/2020   S/P total hysterectomy 07/30/2020   Pre-diabetes 07/07/2015   Major depression single episode, in partial remission ([place]) 06/29/2015   Acid reflux 06/29/2015   Carpal tunnel syndrome 06/29/2015   Hypothyroidism 06/29/2015   Paroxysmal digital cyanosis 06/29/2015   OSA (obstructive sleep apnea) 06/29/2015   Hyperlipidemia, mild 06/29/2015   History of endometrial cancer 12/27/2014    PCP: Glean Hess, MD   REFERRING PROVIDER: Glean Hess, MD   REFERRING DIAG: Acute right sided low back pain with right-sided sciatica  Rationale for Evaluation and Treatment Rehabilitation  THERAPY DIAG:  Low back pain  Muscle weakness  Joint stiffness  ONSET DATE: 12/19/21  SUBJECTIVE:                                                                                                                                                                                            SUBJECTIVE STATEMENT:  EVALUATION   03/09/22 Pt. Reports increase in low back pain since July.  Pt. C/o 5/10 pain in low back/ R buttocks with radicular symptoms to R calf (4/10).  0/10 pain at rest.  Pt. States heat helps.  Increase pain in morning and pt. States B hips with lock up while walking to car at end of workday.  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    PERTINENT HISTORY:  Chief Complaint: Back Pain (Lower Back pain - gluteus  pain is the worst / Right Calf Pain.)   Back Pain This is a new problem. The current episode started more than 1 month ago. The problem occurs daily. The problem is unchanged. The pain is present in the gluteal. The quality of the pain is described as aching. The pain radiates to the right thigh. The pain is mild. Pertinent negatives include no chest pain. She has tried NSAIDs, muscle relaxant and heat for the symptoms. The treatment provided no relief.   Assessment and Plan: 1. Acute right-sided low back pain with right-sided sciatica Minimal response to conservative care with Mobic and Flexeril. Continue Flexeril and heat Steroid taper PT referral - predniSONE (DELTASONE) 10 MG tablet; Take 6 on day 1and 2, 5 on day 3 and 4, 4 on day 5 and 6 , 3 on day 7 and 8, 2 on day 9 and 10 and 1 on day 11 and 12 then stop.  Dispense: 42 tablet; Refill: 0 - Ambulatory referral to Physical Therapy     Partially dictated using Dragon software. Any errors are unintentional.   Halina Maidens, MD [facility]   02/08/2022    PAIN:  Are you having pain? Yes: NPRS scale: 5/10 Pain location: low back/ R buttocks Pain description: sore/ tight Aggravating factors: increase activity Relieving factors: rest   PRECAUTIONS: None  WEIGHT BEARING RESTRICTIONS No  FALLS:  Has patient fallen in last 6 months? No  LIVING ENVIRONMENT: Lives with: lives alone Lives in: House/apartment Stairs: pt. Avoids stairs  secondary to pain/ using elevator at work.    OCCUPATION: Works at [employer] Decrease low back pain/ improve pain-free mobility   OBJECTIVE:   DIAGNOSTIC FINDINGS:  CLINICAL DATA:  Low back pain   EXAM: LUMBAR SPINE - COMPLETE 4+ VIEW   COMPARISON:  None Available.   FINDINGS: Lumbar vertebral body height height are preserved without evidence of fracture. Grade 1 anterolisthesis of L4 on L5. Intervertebral disc spaces are preserved. Facet arthropathy from L4-S1.   IMPRESSION: Degenerative changes of the lumbar spine as described.     Electronically Signed   By: Ofilia Neas M.D.   On: 12/25/2021 13:08  PATIENT SURVEYS:  FOTO initial 49/ goal 61  SCREENING FOR RED FLAGS: Bowel or bladder incontinence: No Spinal tumors: No Cauda equina syndrome: No Compression fracture: No Abdominal aneurysm: No  COGNITION:  Overall cognitive status: Within functional limits for tasks assessed     SENSATION: WFL  MUSCLE LENGTH: Hamstrings: Right WFL; Left WFL Thomas test: NT  POSTURE: Slight rounded shoulders  PALPATION: Tenderness in R piriformis/ L3-5 spinous processes/ R gastroc.    LUMBAR ROM:   Lumbar AROM WNL.  No increase pain with standing lumbar flexion.  Lumbar rotn. "Feels good"  LOWER EXTREMITY ROM:     B LE AROM WNL  LOWER EXTREMITY MMT:      B LE muscle strength grossly 5/5 MMT except R hip flexion 4/5 MMT (slight pain).     Increase R LBP with prone L hip extension.    LUMBAR SPECIAL TESTS:  Straight leg raise test: Negative, FABER test: Negative, and Gaenslen's test: Negative  GAIT: Distance walked: in clinic Assistive device utilized: None Level of assistance: Complete Independence Comments: Slight increase in low back pain with increase distance walked.  Recip. Gait pattern    TODAY'S TREATMENT   03/25/22  Subjective:  Pt. Reports continued pain into R calf.  Pt. Reports compliance  with  stretching/ nerve glides but pain persists.  6/10 R calf pain.    There.ex.:   Nustep L4 10 min. B UE/LE.  Discussed sitting/standing for choir and persistent R calf pain.     Reassessment of lumbar AROM.  Slight increase in symptoms with R rotn.     (+) R Slump test.   Manual tx.:  Supine LE/lumbar stretches (generalized) with static holds.  Prone STM to low thoracic/lumbar/superior glut musculature (use of Hypervolt).  MH to varying locations of low back.       PATIENT EDUCATION:  Education details: Access Code: [email or web address] Person educated: Patient Education method: Explanation, Demonstration, and Handouts Education comprehension: verbalized understanding and returned demonstration   HOME EXERCISE PROGRAM: Access Code: [email or web address] URL: [email or web address] Date: 03/09/2022 Prepared by: Dorcas Carrow   Exercises - Hooklying Transversus Abdominis Palpation  - 2 x daily - 7 x weekly - 1 sets - 10 reps - Supine Lower Trunk Rotation  - 2 x daily - 7 x weekly - 1 sets - 3 reps - Supine Figure 4 Piriformis Stretch  - 2 x daily - 7 x weekly - 1 sets - 3 reps - Supine Piriformis Stretch with Leg Straight  - 2 x daily - 7 x weekly - 1 sets - 3 reps - 10 seconds hold - Supine Sciatic Nerve Glide  - 2 x daily - 7 x weekly - 1 sets - 3 reps - 10 seconds hold - Seated Sciatic Tensioner  - 2 x daily - 7 x weekly - 1 sets - 3 reps - 10 seconds hold  ASSESSMENT:  CLINICAL IMPRESSION: Pt. Reports slight improvement in calf pain with neural glides in seated/ supine position. Patient understands TrA muscle activation and importance of lumbar/core stability.  Good tx. Tolerance with STM to lumbar musculature in prone position.  Good lumbar rotn. L/R.  Pt. Will benefit from skilled PT services to increase core strengthening and pain-free mobility to promote return to prior level of function.     OBJECTIVE IMPAIRMENTS decreased endurance, decreased mobility, difficulty walking,  decreased ROM, decreased strength, impaired flexibility, improper body mechanics, obesity, and pain.   ACTIVITY LIMITATIONS carrying, lifting, bending, sitting, standing, squatting, sleeping, stairs, transfers, locomotion level, and caring for others  PARTICIPATION LIMITATIONS: occupation  [place] and Past/current experiences are also affecting patient's functional outcome.   REHAB POTENTIAL: Good  CLINICAL DECISION MAKING: Stable/uncomplicated  EVALUATION COMPLEXITY: Low   GOALS: Goals reviewed with patient? Yes  SHORT TERM GOALS: Target date: 03/23/22  Pt. Independent with HEP to increase core/ hip strength to improve pain-free mobility.  Baseline:  see above Goal status: Partially met   LONG TERM GOALS: Target date: 04/06/22  Pt. Will increase FOTO to 61 to improve pain-free functional mobility.   Baseline:  initial FOTO: 49 Goal status: INITIAL  2.  Pt. Will reports no tenderness/ radicular symptoms into R calf to improve pain-free mobility.   Baseline: (+) R calf tenderness/ pain Goal status: INITIAL  3.  Pt. Able to walk to car at end of work-day with no c/o low back/ hip pain.   Baseline: pain limited at end of day Goal status: INITIAL   PLAN: PT FREQUENCY: 2x/week  PT DURATION: 4 weeks  PLANNED INTERVENTIONS: Therapeutic exercises, Therapeutic activity, Neuromuscular re-education, Balance training, Gait training, Patient/Family education, Self Care, Joint mobilization, and Manual therapy.  PLAN FOR NEXT SESSION: Progress TrA ex.  Pura Spice, PT, DPT # [id number] 03/27/2022, 11:11 AM

## 2022-03-30 ENCOUNTER — Ambulatory Visit: Payer: BC Managed Care – PPO | Admitting: Physical Therapy

## 2022-03-30 DIAGNOSIS — M5459 Other low back pain: Secondary | ICD-10-CM | POA: Diagnosis not present

## 2022-03-30 DIAGNOSIS — M2560 Stiffness of unspecified joint, not elsewhere classified: Secondary | ICD-10-CM

## 2022-03-30 DIAGNOSIS — M6281 Muscle weakness (generalized): Secondary | ICD-10-CM

## 2022-03-30 NOTE — Therapy (Signed)
OUTPATIENT PHYSICAL THERAPY THORACOLUMBAR TREATMENT   Patient Name: Marilyn Robertson MRN: [id number] DOB:[birth year], 51 y.o., female Today's Date: 03/30/22   PT End of Session - 03/30/22 0816     Visit Number 6    Number of Visits 9    Date for PT Re-Evaluation 04/06/22    PT Start Time 0816    PT Stop Time 0902    PT Time Calculation (min) 46 min              Past Medical History:  Diagnosis Date   Cancer ([place])    Headache    Sleep apnea    Thyroid disorder    Past Surgical History:  Procedure Laterality Date   BREAST REDUCTION SURGERY  1996   BREAST SURGERY     COLONOSCOPY WITH PROPOFOL N/A 08/24/2017   Procedure: COLONOSCOPY WITH PROPOFOL;  Surgeon: Toledo, Benay Pike, MD;  Location: [facility] ENDOSCOPY;  Service: Gastroenterology;  Laterality: N/A;   ROBOTIC ASSISTED TOTAL HYSTERECTOMY  2016   atypical endometrial hyperplasia   TOTAL ABDOMINAL HYSTERECTOMY     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, not intractable 09/14/2020   S/P total hysterectomy 07/30/2020   Pre-diabetes 07/07/2015   Major depression single episode, in partial remission ([place]) 06/29/2015   Acid reflux 06/29/2015   Carpal tunnel syndrome 06/29/2015   Hypothyroidism 06/29/2015   Paroxysmal digital cyanosis 06/29/2015   OSA (obstructive sleep apnea) 06/29/2015   Hyperlipidemia, mild 06/29/2015   History of endometrial cancer 12/27/2014    PCP: Glean Hess, MD   REFERRING PROVIDER: Glean Hess, MD   REFERRING DIAG: Acute right sided low back pain with right-sided sciatica  Rationale for Evaluation and Treatment Rehabilitation  THERAPY DIAG:  Low back pain  Muscle weakness  Joint stiffness  ONSET DATE: 12/19/21  SUBJECTIVE:                                                                                                                                                                                            SUBJECTIVE STATEMENT:  EVALUATION   03/09/22 Pt. Reports increase in low back pain since July.  Pt. C/o 5/10 pain in low back/ R buttocks with radicular symptoms to R calf (4/10).  0/10 pain at rest.  Pt. States heat helps.  Increase pain in morning and pt. States B hips with lock up while walking to car at end of workday.  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    PERTINENT HISTORY:  Chief Complaint: Back Pain (Lower Back pain - gluteus  pain is the worst / Right Calf Pain.)   Back Pain This is a new problem. The current episode started more than 1 month ago. The problem occurs daily. The problem is unchanged. The pain is present in the gluteal. The quality of the pain is described as aching. The pain radiates to the right thigh. The pain is mild. Pertinent negatives include no chest pain. She has tried NSAIDs, muscle relaxant and heat for the symptoms. The treatment provided no relief.   Assessment and Plan: 1. Acute right-sided low back pain with right-sided sciatica Minimal response to conservative care with Mobic and Flexeril. Continue Flexeril and heat Steroid taper PT referral - predniSONE (DELTASONE) 10 MG tablet; Take 6 on day 1and 2, 5 on day 3 and 4, 4 on day 5 and 6 , 3 on day 7 and 8, 2 on day 9 and 10 and 1 on day 11 and 12 then stop.  Dispense: 42 tablet; Refill: 0 - Ambulatory referral to Physical Therapy     Partially dictated using Dragon software. Any errors are unintentional.   Halina Maidens, MD [facility]   02/08/2022    PAIN:  Are you having pain? Yes: NPRS scale: 5/10 Pain location: low back/ R buttocks Pain description: sore/ tight Aggravating factors: increase activity Relieving factors: rest   PRECAUTIONS: None  WEIGHT BEARING RESTRICTIONS No  FALLS:  Has patient fallen in last 6 months? No  LIVING ENVIRONMENT: Lives with: lives alone Lives in: House/apartment Stairs: pt. Avoids stairs  secondary to pain/ using elevator at work.    OCCUPATION: Works at [employer] Decrease low back pain/ improve pain-free mobility   OBJECTIVE:   DIAGNOSTIC FINDINGS:  CLINICAL DATA:  Low back pain   EXAM: LUMBAR SPINE - COMPLETE 4+ VIEW   COMPARISON:  None Available.   FINDINGS: Lumbar vertebral body height height are preserved without evidence of fracture. Grade 1 anterolisthesis of L4 on L5. Intervertebral disc spaces are preserved. Facet arthropathy from L4-S1.   IMPRESSION: Degenerative changes of the lumbar spine as described.     Electronically Signed   By: Ofilia Neas M.D.   On: 12/25/2021 13:08  PATIENT SURVEYS:  FOTO initial 49/ goal 61  SCREENING FOR RED FLAGS: Bowel or bladder incontinence: No Spinal tumors: No Cauda equina syndrome: No Compression fracture: No Abdominal aneurysm: No  COGNITION:  Overall cognitive status: Within functional limits for tasks assessed     SENSATION: WFL  MUSCLE LENGTH: Hamstrings: Right WFL; Left WFL Thomas test: NT  POSTURE: Slight rounded shoulders  PALPATION: Tenderness in R piriformis/ L3-5 spinous processes/ R gastroc.    LUMBAR ROM:   Lumbar AROM WNL.  No increase pain with standing lumbar flexion.  Lumbar rotn. "Feels good"  LOWER EXTREMITY ROM:     B LE AROM WNL  LOWER EXTREMITY MMT:      B LE muscle strength grossly 5/5 MMT except R hip flexion 4/5 MMT (slight pain).     Increase R LBP with prone L hip extension.    LUMBAR SPECIAL TESTS:  Straight leg raise test: Negative, FABER test: Negative, and Gaenslen's test: Negative  GAIT: Distance walked: in clinic Assistive device utilized: None Level of assistance: Complete Independence Comments: Slight increase in low back pain with increase distance walked.  Recip. Gait pattern    TODAY'S TREATMENT   03/30/22  Subjective:  Pt. Reports improvement in R calf pain after last PT tx.  Session but  persistent tightness in B low back.   Pt. Reports compliance with stretching/ nerve glides but pain persists.  6/10 R calf pain this morning.    There.ex.:   Nustep L4 10 min. B UE/LE.  Discussed weekend activity.     Reassessment of lumbar AROM.  Slight increase in symptoms with R rotn.     Seated blue ball ex./ glides/ marching/ alt UE and LE.     Manual tx.:  Supine LE/lumbar stretches (generalized) with static holds.  Prone STM to low thoracic/lumbar/superior glut musculature (use of Hypervolt).  MH to varying locations of low back.       PATIENT EDUCATION:  Education details: Access Code: [email or web address] Person educated: Patient Education method: Explanation, Demonstration, and Handouts Education comprehension: verbalized understanding and returned demonstration   HOME EXERCISE PROGRAM: Access Code: [email or web address] URL: [email or web address] Date: 03/09/2022 Prepared by: Dorcas Carrow   Exercises - Hooklying Transversus Abdominis Palpation  - 2 x daily - 7 x weekly - 1 sets - 10 reps - Supine Lower Trunk Rotation  - 2 x daily - 7 x weekly - 1 sets - 3 reps - Supine Figure 4 Piriformis Stretch  - 2 x daily - 7 x weekly - 1 sets - 3 reps - Supine Piriformis Stretch with Leg Straight  - 2 x daily - 7 x weekly - 1 sets - 3 reps - 10 seconds hold - Supine Sciatic Nerve Glide  - 2 x daily - 7 x weekly - 1 sets - 3 reps - 10 seconds hold - Seated Sciatic Tensioner  - 2 x daily - 7 x weekly - 1 sets - 3 reps - 10 seconds hold  ASSESSMENT:  CLINICAL IMPRESSION: Pt. Reports slight improvement in calf pain with neural glides in seated/ supine position. Good tx. Tolerance with STM to lumbar musculature in prone position.  Good lumbar rotn. L/R with slight increase in symptoms with standing R rotn.  Pt. will benefit from skilled PT services to increase core strengthening and pain-free mobility to promote return to prior level of function.     OBJECTIVE IMPAIRMENTS decreased  endurance, decreased mobility, difficulty walking, decreased ROM, decreased strength, impaired flexibility, improper body mechanics, obesity, and pain.   ACTIVITY LIMITATIONS carrying, lifting, bending, sitting, standing, squatting, sleeping, stairs, transfers, locomotion level, and caring for others  PARTICIPATION LIMITATIONS: occupation  [place] and Past/current experiences are also affecting patient's functional outcome.   REHAB POTENTIAL: Good  CLINICAL DECISION MAKING: Stable/uncomplicated  EVALUATION COMPLEXITY: Low   GOALS: Goals reviewed with patient? Yes  SHORT TERM GOALS: Target date: 03/23/22  Pt. Independent with HEP to increase core/ hip strength to improve pain-free mobility.  Baseline:  see above Goal status: Partially met   LONG TERM GOALS: Target date: 04/06/22  Pt. Will increase FOTO to 61 to improve pain-free functional mobility.   Baseline:  initial FOTO: 49 Goal status: INITIAL  2.  Pt. Will reports no tenderness/ radicular symptoms into R calf to improve pain-free mobility.   Baseline: (+) R calf tenderness/ pain Goal status: INITIAL  3.  Pt. Able to walk to car at end of work-day with no c/o low back/ hip pain.   Baseline: pain limited at end of day Goal status: INITIAL   PLAN: PT FREQUENCY: 2x/week  PT DURATION: 4 weeks  PLANNED INTERVENTIONS: Therapeutic exercises, Therapeutic activity, Neuromuscular re-education, Balance training, Gait training, Patient/Family education, Self Care, Joint mobilization, and Manual therapy.  PLAN FOR NEXT SESSION: Progress TrA  ex./ Check FOTO  Pura Spice, PT, DPT # [phone] 03/30/2022, 7:14 PM

## 2022-04-01 ENCOUNTER — Ambulatory Visit: Payer: BC Managed Care – PPO | Admitting: Physical Therapy

## 2022-04-01 DIAGNOSIS — M2560 Stiffness of unspecified joint, not elsewhere classified: Secondary | ICD-10-CM

## 2022-04-01 DIAGNOSIS — M5459 Other low back pain: Secondary | ICD-10-CM

## 2022-04-01 DIAGNOSIS — M6281 Muscle weakness (generalized): Secondary | ICD-10-CM

## 2022-04-06 ENCOUNTER — Ambulatory Visit: Payer: BC Managed Care – PPO | Admitting: Physical Therapy

## 2022-04-06 DIAGNOSIS — M5459 Other low back pain: Secondary | ICD-10-CM

## 2022-04-06 DIAGNOSIS — M6281 Muscle weakness (generalized): Secondary | ICD-10-CM

## 2022-04-06 DIAGNOSIS — M2560 Stiffness of unspecified joint, not elsewhere classified: Secondary | ICD-10-CM

## 2022-04-06 NOTE — Therapy (Signed)
OUTPATIENT PHYSICAL THERAPY THORACOLUMBAR TREATMENT   Patient Name: Marilyn Robertson MRN: [id number] DOB:12-17-1970, 51 y.o., female Today's Date: 04/06/22   PT End of Session - 04/06/22 1013     Visit Number 8    Number of Visits 9    Date for PT Re-Evaluation 04/06/22    PT Start Time 0820    PT Stop Time 0907    PT Time Calculation (min) 47 min              Past Medical History:  Diagnosis Date   Cancer ([place])    Headache    Sleep apnea    Thyroid disorder    Past Surgical History:  Procedure Laterality Date   BREAST REDUCTION SURGERY  1996   BREAST SURGERY     COLONOSCOPY WITH PROPOFOL N/A 08/24/2017   Procedure: COLONOSCOPY WITH PROPOFOL;  Surgeon: Toledo, Benay Pike, MD;  Location: [facility] ENDOSCOPY;  Service: Gastroenterology;  Laterality: N/A;   ROBOTIC ASSISTED TOTAL HYSTERECTOMY  2016   atypical endometrial hyperplasia   TOTAL ABDOMINAL HYSTERECTOMY     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, not intractable 09/14/2020   S/P total hysterectomy 07/30/2020   Pre-diabetes 07/07/2015   Major depression single episode, in partial remission ([place]) 06/29/2015   Acid reflux 06/29/2015   Carpal tunnel syndrome 06/29/2015   Hypothyroidism 06/29/2015   Paroxysmal digital cyanosis 06/29/2015   OSA (obstructive sleep apnea) 06/29/2015   Hyperlipidemia, mild 06/29/2015   History of endometrial cancer 12/27/2014    PCP: Glean Hess, MD   REFERRING PROVIDER: Glean Hess, MD   REFERRING DIAG: Acute right sided low back pain with right-sided sciatica  Rationale for Evaluation and Treatment Rehabilitation  THERAPY DIAG:  Low back pain  Muscle weakness  Joint stiffness  ONSET DATE: 12/19/21  SUBJECTIVE:                                                                                                                                                                                            SUBJECTIVE STATEMENT:  EVALUATION   03/09/22 Pt. Reports increase in low back pain since July.  Pt. C/o 5/10 pain in low back/ R buttocks with radicular symptoms to R calf (4/10).  0/10 pain at rest.  Pt. States heat helps.  Increase pain in morning and pt. States B hips with lock up while walking to car at end of workday.  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    PERTINENT HISTORY:  Chief Complaint: Back Pain (Lower Back pain - gluteus  pain is the worst / Right Calf Pain.)   Back Pain This is a new problem. The current episode started more than 1 month ago. The problem occurs daily. The problem is unchanged. The pain is present in the gluteal. The quality of the pain is described as aching. The pain radiates to the right thigh. The pain is mild. Pertinent negatives include no chest pain. She has tried NSAIDs, muscle relaxant and heat for the symptoms. The treatment provided no relief.   Assessment and Plan: 1. Acute right-sided low back pain with right-sided sciatica Minimal response to conservative care with Mobic and Flexeril. Continue Flexeril and heat Steroid taper PT referral - predniSONE (DELTASONE) 10 MG tablet; Take 6 on day 1and 2, 5 on day 3 and 4, 4 on day 5 and 6 , 3 on day 7 and 8, 2 on day 9 and 10 and 1 on day 11 and 12 then stop.  Dispense: 42 tablet; Refill: 0 - Ambulatory referral to Physical Therapy     Partially dictated using Dragon software. Any errors are unintentional.   Halina Maidens, MD [facility]   02/08/2022    PAIN:  Are you having pain? Yes: NPRS scale: 5/10 Pain location: low back/ R buttocks Pain description: sore/ tight Aggravating factors: increase activity Relieving factors: rest   PRECAUTIONS: None  WEIGHT BEARING RESTRICTIONS No  FALLS:  Has patient fallen in last 6 months? No  LIVING ENVIRONMENT: Lives with: lives alone Lives in: House/apartment Stairs: pt. Avoids stairs  secondary to pain/ using elevator at work.    OCCUPATION: Works at [employer] Decrease low back pain/ improve pain-free mobility   OBJECTIVE:   DIAGNOSTIC FINDINGS:  CLINICAL DATA:  Low back pain   EXAM: LUMBAR SPINE - COMPLETE 4+ VIEW   COMPARISON:  None Available.   FINDINGS: Lumbar vertebral body height height are preserved without evidence of fracture. Grade 1 anterolisthesis of L4 on L5. Intervertebral disc spaces are preserved. Facet arthropathy from L4-S1.   IMPRESSION: Degenerative changes of the lumbar spine as described.     Electronically Signed   By: Ofilia Neas M.D.   On: 12/25/2021 13:08  PATIENT SURVEYS:  FOTO initial 49/ goal 61  SCREENING FOR RED FLAGS: Bowel or bladder incontinence: No Spinal tumors: No Cauda equina syndrome: No Compression fracture: No Abdominal aneurysm: No  COGNITION:  Overall cognitive status: Within functional limits for tasks assessed     SENSATION: WFL  MUSCLE LENGTH: Hamstrings: Right WFL; Left WFL Thomas test: NT  POSTURE: Slight rounded shoulders  PALPATION: Tenderness in R piriformis/ L3-5 spinous processes/ R gastroc.    LUMBAR ROM:   Lumbar AROM WNL.  No increase pain with standing lumbar flexion.  Lumbar rotn. "Feels good"  LOWER EXTREMITY ROM:     B LE AROM WNL  LOWER EXTREMITY MMT:      B LE muscle strength grossly 5/5 MMT except R hip flexion 4/5 MMT (slight pain).     Increase R LBP with prone L hip extension.    LUMBAR SPECIAL TESTS:  Straight leg raise test: Negative, FABER test: Negative, and Gaenslen's test: Negative  GAIT: Distance walked: in clinic Assistive device utilized: None Level of assistance: Complete Independence Comments: Slight increase in low back pain with increase distance walked.  Recip. Gait pattern    TODAY'S TREATMENT   04/06/22  Subjective:  Pt. States low back "feels tight" and c/o 2/10 pain in  R calf (improving).   Pt. Had an increase in back pain during household chores/ laundry this past Sunday.    There.ex.:   Nustep L4 10 min. B UE/LE.  Discussed weekend activities.       Seated blue ball ex.: LAQ with neural glides on L/R (added neck flexion).  Seated TrA ex. With marching and progressing to alt. UE/LE.  Seated to supine lumbar extension on blue ball with UE assist at //-bars 2x (concerns for vertigo).     Manual tx.:  Supine LE/lumbar stretches (generalized) with static holds.  LE neural glides on L/R with added neck flexion   Prone STM to low thoracic/lumbar/superior glut musculature (use of Hypervolt).  MH to varying locations of low back.   STM To R/L gastroc, esp. R lateral aspect.      PATIENT EDUCATION:  Education details: Access Code: [email or web address] Person educated: Patient Education method: Explanation, Demonstration, and Handouts Education comprehension: verbalized understanding and returned demonstration   HOME EXERCISE PROGRAM: Access Code: [email or web address] URL: [email or web address] Date: 03/09/2022 Prepared by: Dorcas Carrow   Exercises - Hooklying Transversus Abdominis Palpation  - 2 x daily - 7 x weekly - 1 sets - 10 reps - Supine Lower Trunk Rotation  - 2 x daily - 7 x weekly - 1 sets - 3 reps - Supine Figure 4 Piriformis Stretch  - 2 x daily - 7 x weekly - 1 sets - 3 reps - Supine Piriformis Stretch with Leg Straight  - 2 x daily - 7 x weekly - 1 sets - 3 reps - 10 seconds hold - Supine Sciatic Nerve Glide  - 2 x daily - 7 x weekly - 1 sets - 3 reps - 10 seconds hold - Seated Sciatic Tensioner  - 2 x daily - 7 x weekly - 1 sets - 3 reps - 10 seconds hold  ASSESSMENT:  CLINICAL IMPRESSION: Pt. Reports  improvement in calf pain with neural glides in seated/ supine position. Good tx. Tolerance with STM to lumbar musculature in prone position.  Good supported lumbar extension on blue ball with static holds.  Pt. Limited with slight vertigo during supine to seated  position (resolved quickly).  Pt. will benefit from skilled PT services to increase core strengthening and pain-free mobility to promote return to prior level of function.     OBJECTIVE IMPAIRMENTS decreased endurance, decreased mobility, difficulty walking, decreased ROM, decreased strength, impaired flexibility, improper body mechanics, obesity, and pain.   ACTIVITY LIMITATIONS carrying, lifting, bending, sitting, standing, squatting, sleeping, stairs, transfers, locomotion level, and caring for others  PARTICIPATION LIMITATIONS: occupation  [place] and Past/current experiences are also affecting patient's functional outcome.   REHAB POTENTIAL: Good  CLINICAL DECISION MAKING: Stable/uncomplicated  EVALUATION COMPLEXITY: Low   GOALS: Goals reviewed with patient? Yes  SHORT TERM GOALS: Target date: 03/23/22  Pt. Independent with HEP to increase core/ hip strength to improve pain-free mobility.  Baseline:  see above Goal status: Partially met   LONG TERM GOALS: Target date: 04/06/22  Pt. Will increase FOTO to 61 to improve pain-free functional mobility.   Baseline:  initial FOTO: 49 Goal status: INITIAL  2.  Pt. Will reports no tenderness/ radicular symptoms into R calf to improve pain-free mobility.   Baseline: (+) R calf tenderness/ pain Goal status: Partially met  3.  Pt. Able to walk to car at end of work-day with no c/o low back/ hip pain.   Baseline: pain limited at end of day Goal status:  Partially met   PLAN: PT FREQUENCY: 2x/week  PT DURATION: 4 weeks  PLANNED INTERVENTIONS: Therapeutic exercises, Therapeutic activity, Neuromuscular re-education, Balance training, Gait training, Patient/Family education, Self Care, Joint mobilization, and Manual therapy.  PLAN FOR NEXT SESSION: RECERT next tx./ discuss POC  Pura Spice, PT, DPT # [phone] 04/06/2022, 10:31 AM

## 2022-04-06 NOTE — Therapy (Signed)
OUTPATIENT PHYSICAL THERAPY THORACOLUMBAR TREATMENT   Patient Name: Marilyn Robertson MRN: [id number] DOB:08/29/1970, 51 y.o., female Today's Date: 04/01/22   PT End of Session - 04/06/22 1018     Visit Number 7    Number of Visits 9    Date for PT Re-Evaluation 04/06/22    PT Start Time 0820    PT Stop Time 0903    PT Time Calculation (min) 43 min              Past Medical History:  Diagnosis Date   Cancer ([place])    Headache    Sleep apnea    Thyroid disorder    Past Surgical History:  Procedure Laterality Date   BREAST REDUCTION SURGERY  1996   BREAST SURGERY     COLONOSCOPY WITH PROPOFOL N/A 08/24/2017   Procedure: COLONOSCOPY WITH PROPOFOL;  Surgeon: Toledo, Benay Pike, MD;  Location: [facility] ENDOSCOPY;  Service: Gastroenterology;  Laterality: N/A;   ROBOTIC ASSISTED TOTAL HYSTERECTOMY  2016   atypical endometrial hyperplasia   TOTAL ABDOMINAL HYSTERECTOMY     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, not intractable 09/14/2020   S/P total hysterectomy 07/30/2020   Pre-diabetes 07/07/2015   Major depression single episode, in partial remission ([place]) 06/29/2015   Acid reflux 06/29/2015   Carpal tunnel syndrome 06/29/2015   Hypothyroidism 06/29/2015   Paroxysmal digital cyanosis 06/29/2015   OSA (obstructive sleep apnea) 06/29/2015   Hyperlipidemia, mild 06/29/2015   History of endometrial cancer 12/27/2014    PCP: Glean Hess, MD   REFERRING PROVIDER: Glean Hess, MD   REFERRING DIAG: Acute right sided low back pain with right-sided sciatica  Rationale for Evaluation and Treatment Rehabilitation  THERAPY DIAG:  Low back pain  Muscle weakness  Joint stiffness  ONSET DATE: 12/19/21  SUBJECTIVE:                                                                                                                                                                                            SUBJECTIVE STATEMENT:  EVALUATION   03/09/22 Pt. Reports increase in low back pain since July.  Pt. C/o 5/10 pain in low back/ R buttocks with radicular symptoms to R calf (4/10).  0/10 pain at rest.  Pt. States heat helps.  Increase pain in morning and pt. States B hips with lock up while walking to car at end of workday.  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    PERTINENT HISTORY:  Chief Complaint: Back Pain (Lower Back pain - gluteus  pain is the worst / Right Calf Pain.)   Back Pain This is a new problem. The current episode started more than 1 month ago. The problem occurs daily. The problem is unchanged. The pain is present in the gluteal. The quality of the pain is described as aching. The pain radiates to the right thigh. The pain is mild. Pertinent negatives include no chest pain. She has tried NSAIDs, muscle relaxant and heat for the symptoms. The treatment provided no relief.   Assessment and Plan: 1. Acute right-sided low back pain with right-sided sciatica Minimal response to conservative care with Mobic and Flexeril. Continue Flexeril and heat Steroid taper PT referral - predniSONE (DELTASONE) 10 MG tablet; Take 6 on day 1and 2, 5 on day 3 and 4, 4 on day 5 and 6 , 3 on day 7 and 8, 2 on day 9 and 10 and 1 on day 11 and 12 then stop.  Dispense: 42 tablet; Refill: 0 - Ambulatory referral to Physical Therapy     Partially dictated using Dragon software. Any errors are unintentional.   Halina Maidens, MD [facility]   02/08/2022    PAIN:  Are you having pain? Yes: NPRS scale: 5/10 Pain location: low back/ R buttocks Pain description: sore/ tight Aggravating factors: increase activity Relieving factors: rest   PRECAUTIONS: None  WEIGHT BEARING RESTRICTIONS No  FALLS:  Has patient fallen in last 6 months? No  LIVING ENVIRONMENT: Lives with: lives alone Lives in: House/apartment Stairs: pt. Avoids stairs  secondary to pain/ using elevator at work.    OCCUPATION: Works at [employer]: [employer] [place] Decrease low back pain/ improve pain-free mobility   OBJECTIVE:   DIAGNOSTIC FINDINGS:  CLINICAL DATA:  Low back pain   EXAM: LUMBAR SPINE - COMPLETE 4+ VIEW   COMPARISON:  None Available.   FINDINGS: Lumbar vertebral body height height are preserved without evidence of fracture. Grade 1 anterolisthesis of L4 on L5. Intervertebral disc spaces are preserved. Facet arthropathy from L4-S1.   IMPRESSION: Degenerative changes of the lumbar spine as described.     Electronically Signed   By: Ofilia Neas M.D.   On: 12/25/2021 13:08  PATIENT SURVEYS:  FOTO initial 49/ goal 61  SCREENING FOR RED FLAGS: Bowel or bladder incontinence: No Spinal tumors: No Cauda equina syndrome: No Compression fracture: No Abdominal aneurysm: No  COGNITION:  Overall cognitive status: Within functional limits for tasks assessed     SENSATION: WFL  MUSCLE LENGTH: Hamstrings: Right WFL; Left WFL Thomas test: NT  POSTURE: Slight rounded shoulders  PALPATION: Tenderness in R piriformis/ L3-5 spinous processes/ R gastroc.    LUMBAR ROM:   Lumbar AROM WNL.  No increase pain with standing lumbar flexion.  Lumbar rotn. "Feels good"  LOWER EXTREMITY ROM:     B LE AROM WNL  LOWER EXTREMITY MMT:      B LE muscle strength grossly 5/5 MMT except R hip flexion 4/5 MMT (slight pain).     Increase R LBP with prone L hip extension.    LUMBAR SPECIAL TESTS:  Straight leg raise test: Negative, FABER test: Negative, and Gaenslen's test: Negative  GAIT: Distance walked: in clinic Assistive device utilized: None Level of assistance: Complete Independence Comments: Slight increase in low back pain with increase distance walked.  Recip. Gait pattern    TODAY'S TREATMENT   04/01/22  Subjective:  Pt. Reports back really hurt yesterday but is better today.  Pt. C/o R median  calf pain today while on Nustep.    There.ex.:   Nustep L4 10 min. B UE/LE.  Discussed walking/ work-related tasks.     Standing prostretch in //-bars 5x with holds for gastroc stretches.   Walking in clinic/ hallway with gait reassessment.     Manual tx.:  Supine LE/lumbar stretches (generalized) with static holds.  LE neural glides on L/R with added chin tuck.    Prone STM to low thoracic/lumbar/superior glut musculature (use of Hypervolt).  MH to varying locations of low back.       PATIENT EDUCATION:  Education details: Access Code: [email or web address] Person educated: Patient Education method: Explanation, Demonstration, and Handouts Education comprehension: verbalized understanding and returned demonstration   HOME EXERCISE PROGRAM: Access Code: [email or web address] URL: [email or web address] Date: 03/09/2022 Prepared by: Dorcas Carrow   Exercises - Hooklying Transversus Abdominis Palpation  - 2 x daily - 7 x weekly - 1 sets - 10 reps - Supine Lower Trunk Rotation  - 2 x daily - 7 x weekly - 1 sets - 3 reps - Supine Figure 4 Piriformis Stretch  - 2 x daily - 7 x weekly - 1 sets - 3 reps - Supine Piriformis Stretch with Leg Straight  - 2 x daily - 7 x weekly - 1 sets - 3 reps - 10 seconds hold - Supine Sciatic Nerve Glide  - 2 x daily - 7 x weekly - 1 sets - 3 reps - 10 seconds hold - Seated Sciatic Tensioner  - 2 x daily - 7 x weekly - 1 sets - 3 reps - 10 seconds hold  ASSESSMENT:  CLINICAL IMPRESSION: Pt. Reports  improvement in calf pain with neural glides in seated/ supine position. Good tx. Tolerance with STM to lumbar musculature in prone position.  Good lumbar rotn. L/R with slight increase in symptoms with standing R rotn.  Pt. will benefit from skilled PT services to increase core strengthening and pain-free mobility to promote return to prior level of function.     OBJECTIVE IMPAIRMENTS decreased endurance, decreased mobility, difficulty walking, decreased ROM,  decreased strength, impaired flexibility, improper body mechanics, obesity, and pain.   ACTIVITY LIMITATIONS carrying, lifting, bending, sitting, standing, squatting, sleeping, stairs, transfers, locomotion level, and caring for others  PARTICIPATION LIMITATIONS: occupation  [place] and Past/current experiences are also affecting patient's functional outcome.   REHAB POTENTIAL: Good  CLINICAL DECISION MAKING: Stable/uncomplicated  EVALUATION COMPLEXITY: Low   GOALS: Goals reviewed with patient? Yes  SHORT TERM GOALS: Target date: 03/23/22  Pt. Independent with HEP to increase core/ hip strength to improve pain-free mobility.  Baseline:  see above Goal status: Partially met   LONG TERM GOALS: Target date: 04/06/22  Pt. Will increase FOTO to 61 to improve pain-free functional mobility.   Baseline:  initial FOTO: 49 Goal status: INITIAL  2.  Pt. Will reports no tenderness/ radicular symptoms into R calf to improve pain-free mobility.   Baseline: (+) R calf tenderness/ pain Goal status: INITIAL  3.  Pt. Able to walk to car at end of work-day with no c/o low back/ hip pain.   Baseline: pain limited at end of day Goal status: INITIAL   PLAN: PT FREQUENCY: 2x/week  PT DURATION: 4 weeks  PLANNED INTERVENTIONS: Therapeutic exercises, Therapeutic activity, Neuromuscular re-education, Balance training, Gait training, Patient/Family education, Self Care, Joint mobilization, and Manual therapy.  PLAN FOR NEXT SESSION: Progress TrA ex./ Check FOTO  Pura Spice, PT, DPT # [phone]  04/06/2022, 10:19 AM

## 2022-04-08 ENCOUNTER — Encounter: Payer: Self-pay | Admitting: Physical Therapy

## 2022-04-08 ENCOUNTER — Ambulatory Visit: Payer: BC Managed Care – PPO | Admitting: Physical Therapy

## 2022-04-08 DIAGNOSIS — M5459 Other low back pain: Secondary | ICD-10-CM | POA: Diagnosis not present

## 2022-04-08 DIAGNOSIS — M6281 Muscle weakness (generalized): Secondary | ICD-10-CM

## 2022-04-08 DIAGNOSIS — M2560 Stiffness of unspecified joint, not elsewhere classified: Secondary | ICD-10-CM

## 2022-04-08 NOTE — Therapy (Signed)
OUTPATIENT PHYSICAL THERAPY THORACOLUMBAR TREATMENT/ RECERTIFICATION   Patient Name: Marilyn Robertson MRN: [id number] DOB:[birth year], 51 y.o., female Today's Date: 04/08/22   PT End of Session - 04/08/22 0823     Visit Number 8    Number of Visits 16    Date for PT Re-Evaluation 05/06/22    PT Start Time 0820    PT Stop Time 0904    PT Time Calculation (min) 44 min              Past Medical History:  Diagnosis Date   Cancer ([place])    Headache    Sleep apnea    Thyroid disorder    Past Surgical History:  Procedure Laterality Date   BREAST REDUCTION SURGERY  1996   BREAST SURGERY     COLONOSCOPY WITH PROPOFOL N/A 08/24/2017   Procedure: COLONOSCOPY WITH PROPOFOL;  Surgeon: Toledo, Benay Pike, MD;  Location: [facility] ENDOSCOPY;  Service: Gastroenterology;  Laterality: N/A;   ROBOTIC ASSISTED TOTAL HYSTERECTOMY  2016   atypical endometrial hyperplasia   TOTAL ABDOMINAL HYSTERECTOMY     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, not intractable 09/14/2020   S/P total hysterectomy 07/30/2020   Pre-diabetes 07/07/2015   Major depression single episode, in partial remission ([place]) 06/29/2015   Acid reflux 06/29/2015   Carpal tunnel syndrome 06/29/2015   Hypothyroidism 06/29/2015   Paroxysmal digital cyanosis 06/29/2015   OSA (obstructive sleep apnea) 06/29/2015   Hyperlipidemia, mild 06/29/2015   History of endometrial cancer 12/27/2014    PCP: Glean Hess, MD   REFERRING PROVIDER: Glean Hess, MD   REFERRING DIAG: Acute right sided low back pain with right-sided sciatica  Rationale for Evaluation and Treatment Rehabilitation  THERAPY DIAG:  Low back pain  Muscle weakness  Joint stiffness  ONSET DATE: 12/19/21  SUBJECTIVE:                                                                                                                                                                                            SUBJECTIVE STATEMENT:  EVALUATION   03/09/22 Pt. Reports increase in low back pain since July.  Pt. C/o 5/10 pain in low back/ R buttocks with radicular symptoms to R calf (4/10).  0/10 pain at rest.  Pt. States heat helps.  Increase pain in morning and pt. States B hips with lock up while walking to car at end of workday.  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    PERTINENT HISTORY:  Chief Complaint: Back Pain (Lower Back pain -  gluteus pain is the worst / Right Calf Pain.)   Back Pain This is a new problem. The current episode started more than 1 month ago. The problem occurs daily. The problem is unchanged. The pain is present in the gluteal. The quality of the pain is described as aching. The pain radiates to the right thigh. The pain is mild. Pertinent negatives include no chest pain. She has tried NSAIDs, muscle relaxant and heat for the symptoms. The treatment provided no relief.   Assessment and Plan: 1. Acute right-sided low back pain with right-sided sciatica Minimal response to conservative care with Mobic and Flexeril. Continue Flexeril and heat Steroid taper PT referral - predniSONE (DELTASONE) 10 MG tablet; Take 6 on day 1and 2, 5 on day 3 and 4, 4 on day 5 and 6 , 3 on day 7 and 8, 2 on day 9 and 10 and 1 on day 11 and 12 then stop.  Dispense: 42 tablet; Refill: 0 - Ambulatory referral to Physical Therapy     Partially dictated using Dragon software. Any errors are unintentional.   Halina Maidens, MD [facility]   02/08/2022    PAIN:  Are you having pain? Yes: NPRS scale: 5/10 Pain location: low back/ R buttocks Pain description: sore/ tight Aggravating factors: increase activity Relieving factors: rest   PRECAUTIONS: None  WEIGHT BEARING RESTRICTIONS No  FALLS:  Has patient fallen in last 6 months? No  LIVING ENVIRONMENT: Lives with: lives alone Lives in: House/apartment Stairs:  pt. Avoids stairs secondary to pain/ using elevator at work.    OCCUPATION: Works at [employer]: [place] Decrease low back pain/ improve pain-free mobility   OBJECTIVE:   DIAGNOSTIC FINDINGS:  CLINICAL DATA:  Low back pain   EXAM: LUMBAR SPINE - COMPLETE 4+ VIEW   COMPARISON:  None Available.   FINDINGS: Lumbar vertebral body height height are preserved without evidence of fracture. Grade 1 anterolisthesis of L4 on L5. Intervertebral disc spaces are preserved. Facet arthropathy from L4-S1.   IMPRESSION: Degenerative changes of the lumbar spine as described.     Electronically Signed   By: Ofilia Neas M.D.   On: 12/25/2021 13:08  PATIENT SURVEYS:  FOTO initial 49/ goal 61  SCREENING FOR RED FLAGS: Bowel or bladder incontinence: No Spinal tumors: No Cauda equina syndrome: No Compression fracture: No Abdominal aneurysm: No  COGNITION:  Overall cognitive status: Within functional limits for tasks assessed     SENSATION: WFL  MUSCLE LENGTH: Hamstrings: Right WFL; Left WFL Thomas test: NT  POSTURE: Slight rounded shoulders  PALPATION: Tenderness in R piriformis/ L3-5 spinous processes/ R gastroc.    LUMBAR ROM:   Lumbar AROM WNL.  No increase pain with standing lumbar flexion.  Lumbar rotn. "Feels good"  LOWER EXTREMITY ROM:     B LE AROM WNL  LOWER EXTREMITY MMT:      B LE muscle strength grossly 5/5 MMT except R hip flexion 4/5 MMT (slight pain).     Increase R LBP with prone L hip extension.    LUMBAR SPECIAL TESTS:  Straight leg raise test: Negative, FABER test: Negative, and Gaenslen's test: Negative  GAIT: Distance walked: in clinic Assistive device utilized: None Level of assistance: Complete Independence Comments: Slight increase in low back pain with increase distance walked.  Recip. Gait pattern    TODAY'S TREATMENT   04/08/22  Subjective:  Pt. States low back "feels tight" this morning.  Pt.  States  calf is still improving.    There.ex.:   Nustep L4 10 min. B UE/LE.   Discussed work-related tasks/ calf pain.     Quadruped ex:  cat-cow 10x.  Added hip extension but pain limited.     Supine ball ex.: TrA knee to chest/ SLR/ rotn./ bridging 20x.      Manual tx.:  Supine LE/lumbar stretches (generalized) with static holds.  LE neural glides on L/R with added neck flexion.   Prone STM to low thoracic/lumbar/superior glut musculature (use of Hypervolt).  MH to varying locations of low back.   STM To R/L gastroc, esp. R lateral aspect.      PATIENT EDUCATION:  Education details: Access Code: [email or web address] Person educated: Patient Education method: Explanation, Demonstration, and Handouts Education comprehension: verbalized understanding and returned demonstration   HOME EXERCISE PROGRAM: Access Code: [email or web address] URL: [email or web address] Date: 03/09/2022 Prepared by: Dorcas Carrow   Exercises - Hooklying Transversus Abdominis Palpation  - 2 x daily - 7 x weekly - 1 sets - 10 reps - Supine Lower Trunk Rotation  - 2 x daily - 7 x weekly - 1 sets - 3 reps - Supine Figure 4 Piriformis Stretch  - 2 x daily - 7 x weekly - 1 sets - 3 reps - Supine Piriformis Stretch with Leg Straight  - 2 x daily - 7 x weekly - 1 sets - 3 reps - 10 seconds hold - Supine Sciatic Nerve Glide  - 2 x daily - 7 x weekly - 1 sets - 3 reps - 10 seconds hold - Seated Sciatic Tensioner  - 2 x daily - 7 x weekly - 1 sets - 3 reps - 10 seconds hold  ASSESSMENT:  CLINICAL IMPRESSION: Pt. Reports  improvement in calf pain with neural glides in seated/ supine position. Good tx. Tolerance with STM to lumbar musculature in prone position.  Pt. Progress with core strengthening hip/back pain present during hip extension in quadruped.  Pt. will benefit from skilled PT services to increase core strengthening and pain-free mobility to promote return to prior level of function.     OBJECTIVE IMPAIRMENTS  decreased endurance, decreased mobility, difficulty walking, decreased ROM, decreased strength, impaired flexibility, improper body mechanics, obesity, and pain.   ACTIVITY LIMITATIONS carrying, lifting, bending, sitting, standing, squatting, sleeping, stairs, transfers, locomotion level, and caring for others  PARTICIPATION LIMITATIONS: occupation  [place] and Past/current experiences are also affecting patient's functional outcome.   REHAB POTENTIAL: Good  CLINICAL DECISION MAKING: Stable/uncomplicated  EVALUATION COMPLEXITY: Low   GOALS: Goals reviewed with patient? Yes  SHORT TERM GOALS: Target date: 04/22/22  Pt. Independent with HEP to increase core/ hip strength to improve pain-free mobility.  Baseline:  see above Goal status: Partially met   LONG TERM GOALS: Target date: 05/06/22  Pt. Will increase FOTO to 61 to improve pain-free functional mobility.   Baseline:  initial FOTO: 49.  10/19: 53 Goal status: Not met  2.  Pt. Will reports no tenderness/ radicular symptoms into R calf to improve pain-free mobility.   Baseline: (+) R calf tenderness/ pain.  10/19: improving Goal status: Partially met  3.  Pt. Able to walk to car at end of work-day with no c/o low back/ hip pain.   Baseline: pain limited at end of day Goal status:  Partially met   PLAN: PT FREQUENCY: 2x/week  PT DURATION: 4 weeks  PLANNED INTERVENTIONS: Therapeutic exercises, Therapeutic activity, Neuromuscular re-education, Balance training, Gait training, Patient/Family education, Self  Care, Joint mobilization, and Manual therapy.  PLAN FOR NEXT SESSION: Progress towards more independent HEP  Pura Spice, PT, DPT # [id number] 04/08/2022, 3:35 PM

## 2022-04-13 ENCOUNTER — Ambulatory Visit: Payer: BC Managed Care – PPO | Admitting: Physical Therapy

## 2022-04-13 DIAGNOSIS — M2560 Stiffness of unspecified joint, not elsewhere classified: Secondary | ICD-10-CM

## 2022-04-13 DIAGNOSIS — M5459 Other low back pain: Secondary | ICD-10-CM

## 2022-04-13 DIAGNOSIS — M6281 Muscle weakness (generalized): Secondary | ICD-10-CM

## 2022-04-13 NOTE — Therapy (Signed)
OUTPATIENT PHYSICAL THERAPY THORACOLUMBAR TREATMENT   Patient Name: Marilyn Robertson MRN: [id number] DOB:09-01-1970, 51 y.o., female Today's Date: 04/13/22   PT End of Session - 04/13/22 2050     Visit Number 9    Number of Visits 16    Date for PT Re-Evaluation 05/06/22    PT Start Time 0820    PT Stop Time 0903    PT Time Calculation (min) 43 min              Past Medical History:  Diagnosis Date   Cancer ([place])    Headache    Sleep apnea    Thyroid disorder    Past Surgical History:  Procedure Laterality Date   BREAST REDUCTION SURGERY  1996   BREAST SURGERY     COLONOSCOPY WITH PROPOFOL N/A 08/24/2017   Procedure: COLONOSCOPY WITH PROPOFOL;  Surgeon: Marilyn Robertson, Marilyn Pike, MD;  Location: [facility] ENDOSCOPY;  Service: Gastroenterology;  Laterality: N/A;   ROBOTIC ASSISTED TOTAL HYSTERECTOMY  2016   atypical endometrial hyperplasia   TOTAL ABDOMINAL HYSTERECTOMY     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, not intractable 09/14/2020   S/P total hysterectomy 07/30/2020   Pre-diabetes 07/07/2015   Major depression single episode, in partial remission ([place]) 06/29/2015   Acid reflux 06/29/2015   Carpal tunnel syndrome 06/29/2015   Hypothyroidism 06/29/2015   Paroxysmal digital cyanosis 06/29/2015   OSA (obstructive sleep apnea) 06/29/2015   Hyperlipidemia, mild 06/29/2015   History of endometrial cancer 12/27/2014    PCP: Marilyn Hess, MD   REFERRING PROVIDER: Glean Hess, MD   REFERRING DIAG: Acute right sided low back pain with right-sided sciatica  Rationale for Evaluation and Treatment Rehabilitation  THERAPY DIAG:  Low back pain  Muscle weakness  Joint stiffness  ONSET DATE: 12/19/21  SUBJECTIVE:                                                                                                                                                                                            SUBJECTIVE STATEMENT:  EVALUATION   03/09/22 Pt. Reports increase in low back pain since July.  Pt. C/o 5/10 pain in low back/ R buttocks with radicular symptoms to R calf (4/10).  0/10 pain at rest.  Pt. States heat helps.  Increase pain in morning and pt. States B hips with lock up while walking to car at end of workday.  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    PERTINENT HISTORY:  Chief Complaint: Back Pain (Lower Back pain - gluteus  pain is the worst / Right Calf Pain.)   Back Pain This is a new problem. The current episode started more than 1 month ago. The problem occurs daily. The problem is unchanged. The pain is present in the gluteal. The quality of the pain is described as aching. The pain radiates to the right thigh. The pain is mild. Pertinent negatives include no chest pain. She has tried NSAIDs, muscle relaxant and heat for the symptoms. The treatment provided no relief.   Assessment and Plan: 1. Acute right-sided low back pain with right-sided sciatica Minimal response to conservative care with Mobic and Flexeril. Continue Flexeril and heat Steroid taper PT referral - predniSONE (DELTASONE) 10 MG tablet; Take 6 on day 1and 2, 5 on day 3 and 4, 4 on day 5 and 6 , 3 on day 7 and 8, 2 on day 9 and 10 and 1 on day 11 and 12 then stop.  Dispense: 42 tablet; Refill: 0 - Ambulatory referral to Physical Therapy     Partially dictated using Dragon software. Any errors are unintentional.   Marilyn Maidens, MD [name]   02/08/2022    PAIN:  Are you having pain? Yes: NPRS scale: 5/10 Pain location: low back/ R buttocks Pain description: sore/ tight Aggravating factors: increase activity Relieving factors: rest   PRECAUTIONS: None  WEIGHT BEARING RESTRICTIONS No  FALLS:  Has patient fallen in last 6 months? No  LIVING ENVIRONMENT: Lives with: lives alone Lives in: House/apartment Stairs: pt. Avoids stairs  secondary to pain/ using elevator at work.    OCCUPATION: Works at [employer] Decrease low back pain/ improve pain-free mobility   OBJECTIVE:   DIAGNOSTIC FINDINGS:  CLINICAL DATA:  Low back pain   EXAM: LUMBAR SPINE - COMPLETE 4+ VIEW   COMPARISON:  None Available.   FINDINGS: Lumbar vertebral body height height are preserved without evidence of fracture. Grade 1 anterolisthesis of L4 on L5. Intervertebral disc spaces are preserved. Facet arthropathy from L4-S1.   IMPRESSION: Degenerative changes of the lumbar spine as described.     Electronically Signed   By: Marilyn Robertson M.D.   On: 12/25/2021 13:08  PATIENT SURVEYS:  FOTO initial 49/ goal 61  SCREENING FOR RED FLAGS: Bowel or bladder incontinence: No Spinal tumors: No Cauda equina syndrome: No Compression fracture: No Abdominal aneurysm: No  COGNITION:  Overall cognitive status: Within functional limits for tasks assessed     SENSATION: WFL  MUSCLE LENGTH: Hamstrings: Right WFL; Left WFL Thomas test: NT  POSTURE: Slight rounded shoulders  PALPATION: Tenderness in R piriformis/ L3-5 spinous processes/ R gastroc.    LUMBAR ROM:   Lumbar AROM WNL.  No increase pain with standing lumbar flexion.  Lumbar rotn. "Feels good"  LOWER EXTREMITY ROM:     B LE AROM WNL  LOWER EXTREMITY MMT:      B LE muscle strength grossly 5/5 MMT except R hip flexion 4/5 MMT (slight pain).     Increase R LBP with prone L hip extension.    LUMBAR SPECIAL TESTS:  Straight leg raise test: Negative, FABER test: Negative, and Gaenslen's test: Negative  GAIT: Distance walked: in clinic Assistive device utilized: None Level of assistance: Complete Independence Comments: Slight increase in low back pain with increase distance walked.  Recip. Gait pattern    TODAY'S TREATMENT   04/13/22  Subjective:  Pt. Reports having a great day yesterday but had increase pain  last Thursday/  Friday.  R gastroc tenderness/ discomfort remains.    There.ex.:   Nustep L4 10 min. B UE/LE.   Discussed work-related tasks/ calf pain.     Standing calf stretch with 3" plinth at //-bars on L/R 3x each with static holds.     Supine white ball ex.: TrA knee to chest/ SLR/ rotn./ bridging 20x.      Manual tx.:  Supine LE/lumbar stretches (generalized) with static holds.  LE neural glides on L/R (gentle oscillations as tolerated).    Prone STM to low thoracic/lumbar/superior glut musculature (use of Hypervolt).  MH to varying locations of low back.   STM To R/L gastroc, esp. R lateral aspect.      PATIENT EDUCATION:  Education details: Access Code: [email or web address] Person educated: Patient Education method: Explanation, Demonstration, and Handouts Education comprehension: verbalized understanding and returned demonstration   HOME EXERCISE PROGRAM: Access Code: [email or web address] URL: [email or web address] Date: 03/09/2022 Prepared by: Dorcas Carrow   Exercises - Hooklying Transversus Abdominis Palpation  - 2 x daily - 7 x weekly - 1 sets - 10 reps - Supine Lower Trunk Rotation  - 2 x daily - 7 x weekly - 1 sets - 3 reps - Supine Figure 4 Piriformis Stretch  - 2 x daily - 7 x weekly - 1 sets - 3 reps - Supine Piriformis Stretch with Leg Straight  - 2 x daily - 7 x weekly - 1 sets - 3 reps - 10 seconds hold - Supine Sciatic Nerve Glide  - 2 x daily - 7 x weekly - 1 sets - 3 reps - 10 seconds hold - Seated Sciatic Tensioner  - 2 x daily - 7 x weekly - 1 sets - 3 reps - 10 seconds hold  ASSESSMENT:  CLINICAL IMPRESSION: Pt. Reports  improvement in calf pain with neural glides in seated/ supine position. Good tx. Tolerance with STM to lumbar musculature in prone position.  Pt. Progressing with core strengthening with no increase c/o pain and good technique.  Pt. will benefit from skilled PT services to increase core strengthening and pain-free mobility to promote return to prior  level of function.     OBJECTIVE IMPAIRMENTS decreased endurance, decreased mobility, difficulty walking, decreased ROM, decreased strength, impaired flexibility, improper body mechanics, obesity, and pain.   ACTIVITY LIMITATIONS carrying, lifting, bending, sitting, standing, squatting, sleeping, stairs, transfers, locomotion level, and caring for others  PARTICIPATION LIMITATIONS: occupation  [place] and Past/current experiences are also affecting patient's functional outcome.   REHAB POTENTIAL: Good  CLINICAL DECISION MAKING: Stable/uncomplicated  EVALUATION COMPLEXITY: Low   GOALS: Goals reviewed with patient? Yes  SHORT TERM GOALS: Target date: 04/22/22  Pt. Independent with HEP to increase core/ hip strength to improve pain-free mobility.  Baseline:  see above Goal status: Partially met   LONG TERM GOALS: Target date: 05/06/22  Pt. Will increase FOTO to 61 to improve pain-free functional mobility.   Baseline:  initial FOTO: 49.  10/19: 53 Goal status: Not met  2.  Pt. Will reports no tenderness/ radicular symptoms into R calf to improve pain-free mobility.   Baseline: (+) R calf tenderness/ pain.  10/19: improving Goal status: Partially met  3.  Pt. Able to walk to car at end of work-day with no c/o low back/ hip pain.   Baseline: pain limited at end of day Goal status:  Partially met   PLAN: PT FREQUENCY: 2x/week  PT DURATION: 4 weeks  PLANNED INTERVENTIONS: Therapeutic exercises, Therapeutic activity,  Neuromuscular re-education, Balance training, Gait training, Patient/Family education, Self Care, Joint mobilization, and Manual therapy.  PLAN FOR NEXT SESSION: Progress towards more independent HEP.  1 more tx. Session.    Pura Spice, PT, DPT # [phone] 04/13/2022, 8:51 PM

## 2022-04-15 ENCOUNTER — Ambulatory Visit: Payer: BC Managed Care – PPO | Admitting: Physical Therapy

## 2022-04-15 DIAGNOSIS — M6281 Muscle weakness (generalized): Secondary | ICD-10-CM

## 2022-04-15 DIAGNOSIS — M5459 Other low back pain: Secondary | ICD-10-CM | POA: Diagnosis not present

## 2022-04-15 DIAGNOSIS — M2560 Stiffness of unspecified joint, not elsewhere classified: Secondary | ICD-10-CM

## 2022-04-18 NOTE — Therapy (Signed)
OUTPATIENT PHYSICAL THERAPY THORACOLUMBAR TREATMENT Physical Therapy Progress Note   Dates of reporting period  03/09/22  to  04/15/22   Patient Name: Marilyn Robertson MRN: [id number] DOB:Sep 09, 1970, 51 y.o., female Today's Date: 04/15/22   PT End of Session - 04/18/22 1855     Visit Number 10    Number of Visits 16    Date for PT Re-Evaluation 05/06/22    PT Start Time 0824    PT Stop Time 0905    PT Time Calculation (min) 41 min              Past Medical History:  Diagnosis Date   Cancer ([place])    Headache    Sleep apnea    Thyroid disorder    Past Surgical History:  Procedure Laterality Date   BREAST REDUCTION SURGERY  1996   BREAST SURGERY     COLONOSCOPY WITH PROPOFOL N/A 08/24/2017   Procedure: COLONOSCOPY WITH PROPOFOL;  Surgeon: [place], Benay Pike, MD;  Location: [facility] ENDOSCOPY;  Service: Gastroenterology;  Laterality: N/A;   ROBOTIC ASSISTED TOTAL HYSTERECTOMY  2016   atypical endometrial hyperplasia   TOTAL ABDOMINAL HYSTERECTOMY     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, not intractable 09/14/2020   S/P total hysterectomy 07/30/2020   Pre-diabetes 07/07/2015   Major depression single episode, in partial remission ([place]) 06/29/2015   Acid reflux 06/29/2015   Carpal tunnel syndrome 06/29/2015   Hypothyroidism 06/29/2015   Paroxysmal digital cyanosis 06/29/2015   OSA (obstructive sleep apnea) 06/29/2015   Hyperlipidemia, mild 06/29/2015   History of endometrial cancer 12/27/2014    PCP: Glean Hess, MD   REFERRING PROVIDER: Glean Hess, MD   REFERRING DIAG: Acute right sided low back pain with right-sided sciatica  Rationale for Evaluation and Treatment Rehabilitation  THERAPY DIAG:  Low back pain  Muscle weakness  Joint stiffness  ONSET DATE: 12/19/21  SUBJECTIVE:                                                                                                                                                                                            SUBJECTIVE STATEMENT:  EVALUATION   03/09/22 Pt. Reports increase in low back pain since July.  Pt. C/o 5/10 pain in low back/ R buttocks with radicular symptoms to R calf (4/10).  0/10 pain at rest.  Pt. States heat helps.  Increase pain in morning and pt. States B hips with lock up while walking to car at end of workday.  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    PERTINENT HISTORY:  Chief Complaint: Back Pain (Lower Back pain - gluteus pain is the worst / Right Calf Pain.)   Back Pain This is a new problem. The current episode started more than 1 month ago. The problem occurs daily. The problem is unchanged. The pain is present in the gluteal. The quality of the pain is described as aching. The pain radiates to the right thigh. The pain is mild. Pertinent negatives include no chest pain. She has tried NSAIDs, muscle relaxant and heat for the symptoms. The treatment provided no relief.   Assessment and Plan: 1. Acute right-sided low back pain with right-sided sciatica Minimal response to conservative care with Mobic and Flexeril. Continue Flexeril and heat Steroid taper PT referral - predniSONE (DELTASONE) 10 MG tablet; Take 6 on day 1and 2, 5 on day 3 and 4, 4 on day 5 and 6 , 3 on day 7 and 8, 2 on day 9 and 10 and 1 on day 11 and 12 then stop.  Dispense: 42 tablet; Refill: 0 - Ambulatory referral to Physical Therapy     Partially dictated using Dragon software. Any errors are unintentional.   Halina Maidens, MD [facility]   02/08/2022    PAIN:  Are you having pain? Yes: NPRS scale: 5/10 Pain location: low back/ R buttocks Pain description: sore/ tight Aggravating factors: increase activity Relieving factors: rest   PRECAUTIONS: None  WEIGHT BEARING RESTRICTIONS No  FALLS:  Has patient fallen in last 6 months? No  LIVING  ENVIRONMENT: Lives with: lives alone Lives in: House/apartment Stairs: pt. Avoids stairs secondary to pain/ using elevator at work.    OCCUPATION: Works at [employer] Decrease low back pain/ improve pain-free mobility   OBJECTIVE:   DIAGNOSTIC FINDINGS:  CLINICAL DATA:  Low back pain   EXAM: LUMBAR SPINE - COMPLETE 4+ VIEW   COMPARISON:  None Available.   FINDINGS: Lumbar vertebral body height height are preserved without evidence of fracture. Grade 1 anterolisthesis of L4 on L5. Intervertebral disc spaces are preserved. Facet arthropathy from L4-S1.   IMPRESSION: Degenerative changes of the lumbar spine as described.     Electronically Signed   By: Ofilia Neas M.D.   On: 12/25/2021 13:08  PATIENT SURVEYS:  FOTO initial 49/ goal 61  SCREENING FOR RED FLAGS: Bowel or bladder incontinence: No Spinal tumors: No Cauda equina syndrome: No Compression fracture: No Abdominal aneurysm: No  COGNITION:  Overall cognitive status: Within functional limits for tasks assessed     SENSATION: WFL  MUSCLE LENGTH: Hamstrings: Right WFL; Left WFL Thomas test: NT  POSTURE: Slight rounded shoulders  PALPATION: Tenderness in R piriformis/ L3-5 spinous processes/ R gastroc.    LUMBAR ROM:   Lumbar AROM WNL.  No increase pain with standing lumbar flexion.  Lumbar rotn. "Feels good"  LOWER EXTREMITY ROM:     B LE AROM WNL  LOWER EXTREMITY MMT:      B LE muscle strength grossly 5/5 MMT except R hip flexion 4/5 MMT (slight pain).     Increase R LBP with prone L hip extension.    LUMBAR SPECIAL TESTS:  Straight leg raise test: Negative, FABER test: Negative, and Gaenslen's test: Negative  GAIT: Distance walked: in clinic Assistive device utilized: None Level of assistance: Complete Independence Comments: Slight increase in low back pain with increase distance walked.  Recip. Gait pattern    TODAY'S TREATMENT  04/15/22  Subjective:  Pt. Reports minimal back issues yesterday.  Pt. Having good days but still limited with increase walking/ activity due to back/ calf pain.      There.ex.:   Nustep L4 10 min. B UE/LE.   Discussed discharge from PT with HEP focus.       Reassessment of lumbar AROM/ goals.  Increase R lumbar discomfort with standing R rotn.  Discussed HEP   Supine on blue mat table: TrA knee to chest/ SLR/ rotn./ bridging with ball.  Alt. UE/ LE with core muscle activation/ focus.  Open book rotn. L/R 5x each.     Manual tx.:  Supine LE/lumbar stretches (generalized) with static holds.  LE neural glides on L/R (gentle oscillations as tolerated).    Prone STM to low thoracic/lumbar/superior glut musculature (use of Hypervolt).  MH to varying locations of low back.   STM To R/L gastroc, esp. R lateral aspect.      PATIENT EDUCATION:  Education details: Access Code: [email or web address] Person educated: Patient Education method: Explanation, Demonstration, and Handouts Education comprehension: verbalized understanding and returned demonstration   HOME EXERCISE PROGRAM: Access Code: [email or web address] URL: [email or web address] Date: 03/09/2022 Prepared by: Dorcas Carrow   Exercises - Hooklying Transversus Abdominis Palpation  - 2 x daily - 7 x weekly - 1 sets - 10 reps - Supine Lower Trunk Rotation  - 2 x daily - 7 x weekly - 1 sets - 3 reps - Supine Figure 4 Piriformis Stretch  - 2 x daily - 7 x weekly - 1 sets - 3 reps - Supine Piriformis Stretch with Leg Straight  - 2 x daily - 7 x weekly - 1 sets - 3 reps - 10 seconds hold - Supine Sciatic Nerve Glide  - 2 x daily - 7 x weekly - 1 sets - 3 reps - 10 seconds hold - Seated Sciatic Tensioner  - 2 x daily - 7 x weekly - 1 sets - 3 reps - 10 seconds hold  ASSESSMENT:  CLINICAL IMPRESSION: Pt. Reports  improvement in calf pain with neural glides in seated/ supine position but not 100%.Kermit Balo tx. Tolerance with STM to lumbar  musculature in prone position.  Pt. Progressing with core strengthening with no increase c/o pain and good technique.  Pt. Will continue with daily stretching/ HEP.  Pt. Instructed to contact PT in a couple weeks to check on symptoms/ status.  Probable discharge from PT at this time.    OBJECTIVE IMPAIRMENTS decreased endurance, decreased mobility, difficulty walking, decreased ROM, decreased strength, impaired flexibility, improper body mechanics, obesity, and pain.   ACTIVITY LIMITATIONS carrying, lifting, bending, sitting, standing, squatting, sleeping, stairs, transfers, locomotion level, and caring for others  PARTICIPATION LIMITATIONS: occupation  [place] and Past/current experiences are also affecting patient's functional outcome.   REHAB POTENTIAL: Good  CLINICAL DECISION MAKING: Stable/uncomplicated  EVALUATION COMPLEXITY: Low   GOALS: Goals reviewed with patient? Yes  SHORT TERM GOALS: Target date: 04/22/22  Pt. Independent with HEP to increase core/ hip strength to improve pain-free mobility.  Baseline:  see above Goal status: Partially met   LONG TERM GOALS: Target date: 05/06/22  Pt. Will increase FOTO to 61 to improve pain-free functional mobility.   Baseline:  initial FOTO: 49.  10/19: 53 Goal status: Not met  2.  Pt. Will reports no tenderness/ radicular symptoms into R calf to improve pain-free mobility.   Baseline: (+) R calf tenderness/ pain.  10/19: improving Goal status: Partially met  3.  Pt. Able to walk to car at end of work-day with no c/o low back/ hip pain.   Baseline: pain limited at end of day Goal status:  Partially met   PLAN: PT FREQUENCY: 2x/week  PT DURATION: 4 weeks  PLANNED INTERVENTIONS: Therapeutic exercises, Therapeutic activity, Neuromuscular re-education, Balance training, Gait training, Patient/Family education, Self Care, Joint mobilization, and Manual therapy.  PLAN FOR NEXT SESSION: Pt. Instructed to contact  PT in a couple weeks.     Pura Spice, PT, DPT # [phone] 04/18/2022, 6:56 PM

## 2022-08-18 ENCOUNTER — Telehealth: Payer: BC Managed Care – PPO | Admitting: Nurse Practitioner

## 2022-08-18 ENCOUNTER — Ambulatory Visit: Payer: Self-pay

## 2022-08-18 ENCOUNTER — Ambulatory Visit: Payer: Self-pay | Admitting: *Deleted

## 2022-08-18 DIAGNOSIS — R5383 Other fatigue: Secondary | ICD-10-CM | POA: Diagnosis not present

## 2022-08-18 NOTE — Telephone Encounter (Signed)
  Chief Complaint: fatigue Symptoms: cough, back ache, light bothering her eyes, nausea, headache, occasional dizziness Frequency: yesterday Pertinent Negatives: Patient denies SOB, fever, vomiting, Disposition: []$ ED /[]$ Urgent Care (no appt availability in office) / []$ Appointment(In office/virtual)/ [x]$  [facility]/ []$ Home Care/ []$ Refused Recommended Disposition /[]$ [place] [facility]/ []$  Follow-up with PCP Additional Notes: [place] Virtual Visit. Called office and spoke to [place] regarding triage and scheduled pt Virtually via [place] visit. Reason for Disposition  [1] MODERATE weakness (i.e., interferes with work, school, normal activities) AND [2] cause unknown  (Exceptions: Weakness from acute minor illness or poor fluid intake; weakness is chronic and not worse.)  Answer Assessment - Initial Assessment Questions 1. DESCRIPTION: "Describe how you are feeling."     *No Answer* 2. SEVERITY: "How bad is it?"  "Can you stand and walk?"   - MILD (0-3): Feels weak or tired, but does not interfere with work, school or normal activities.   - MODERATE (4-7): Able to stand and walk; weakness interferes with work, school, or normal activities.   - SEVERE (8-10): Unable to stand or walk; unable to do usual activities.     Moderate  3. ONSET: "When did these symptoms begin?" (e.g., hours, days, weeks, months)     Fatigue  4. CAUSE: "What do you think is causing the weakness or fatigue?" (e.g., not drinking enough fluids, medical problem, trouble sleeping)     Drinking well.  5. NEW MEDICINES:  "Have you started on any new medicines recently?" (e.g., opioid pain medicines, benzodiazepines, muscle relaxants, antidepressants, antihistamines, neuroleptics, beta blockers)     *No Answer* 6. OTHER SYMPTOMS: "Do you have any other symptoms?" (e.g., chest pain, fever, cough, SOB, vomiting, diarrhea, bleeding, other areas of pain)     Light bothering her, diarrhea, nausea,  headache, cough (mild and productive with pale yellow), ache back, occasional dizziness 7. PREGNANCY: "Is there any chance you are pregnant?" "When was your last menstrual period?"     *No Answer*  Protocols used: Weakness (Generalized) and Fatigue-A-AH

## 2022-08-18 NOTE — Patient Instructions (Signed)
Marilyn Robertson, thank you for joining Chevis Pretty, FNP for today's virtual visit.  While this provider is not your primary care provider (PCP), if your PCP is located in our provider database this encounter information will be shared with them immediately following your visit.   [place] account gives you access to today's visit and all your visits, tests, and labs performed at [facility] " click here if you don't have a [facility] account or go to [email or web address]  Consent: (Patient) Marilyn Robertson provided verbal consent for this virtual visit at the beginning of the encounter.  Current Medications:  Current Outpatient Medications:    butalbital-acetaminophen-caffeine (FIORICET) 50-325-40 MG tablet, Take 1 tablet by mouth every 6 (six) hours as needed. , Disp: , Rfl:    calcium-vitamin D (OSCAL WITH D) 500-200 MG-UNIT tablet, Take 1 tablet by mouth., Disp: , Rfl:    cetirizine (ZYRTEC) 10 MG chewable tablet, Chew 10 mg by mouth daily., Disp: , Rfl:    clonazePAM (KLONOPIN) 1 MG tablet, Take 1 mg by mouth daily as needed., Disp: , Rfl:    cyclobenzaprine (FLEXERIL) 10 MG tablet, Take 1 tablet by mouth three times daily as needed for muscle spasm, Disp: 30 tablet, Rfl: 0   DULoxetine (CYMBALTA) 60 MG capsule, Take 60 mg by mouth daily., Disp: , Rfl:    Galcanezumab-gnlm (EMGALITY) 120 MG/ML SOSY, Inject 120 mg into the skin every 30 (thirty) days., Disp: , Rfl:    ibuprofen (ADVIL) 600 MG tablet, Take 1 tablet (600 mg total) by mouth every 6 (six) hours as needed., Disp: 30 tablet, Rfl: 0   levothyroxine (SYNTHROID) 100 MCG tablet, Take 100 mcg by mouth daily., Disp: , Rfl:    liothyronine (CYTOMEL) 5 MCG tablet, Take 5 mcg by mouth daily., Disp: , Rfl:    meloxicam (MOBIC) 15 MG tablet, Take 1 tablet by mouth once daily, Disp: 30 tablet, Rfl: 0   propranolol ER (INDERAL LA) 60 MG 24 hr capsule, Take 1 capsule by mouth daily., Disp: ,  Rfl:    rizatriptan (MAXALT) 10 MG tablet, Take 10 mg by mouth as needed for migraine. May repeat in 2 hours if needed, Disp: , Rfl:    Selenium 200 MCG CAPS, Take 1 capsule by mouth daily., Disp: , Rfl:    vitamin B-12 (CYANOCOBALAMIN) 500 MCG tablet, Take 500 mcg by mouth daily., Disp: , Rfl:    zinc gluconate 50 MG tablet, Take 50 mg by mouth daily., Disp: , Rfl:    Medications ordered in this encounter:  No orders of the defined types were placed in this encounter.    *If you need refills on other medications prior to your next appointment, please contact your pharmacy*  Follow-Up: Call back or seek an in-person evaluation if the symptoms worsen or if the condition fails to improve as anticipated.  [place] [phone]  Other Instructions Force fluids rest   If you have been instructed to have an in-person evaluation today at a local Urgent Care facility, please use the link below. It will take you to a list of all of our available [facility], including address, phone number and hours of operation. Please do not delay care.  [facility]  If you or a family member do not have a primary care provider, use the link below to schedule a visit and establish care. When you choose a [place] primary care physician or  advanced practice provider, you gain a long-term partner in health. Find a Primary Care Provider  Learn more about [facility]'s in-office and virtual care options: [facility]

## 2022-08-18 NOTE — Telephone Encounter (Signed)
Noted   Pt is going to do a cone VV.  KP

## 2022-08-18 NOTE — Telephone Encounter (Signed)
Returned pt's call.   Female answered the phone and asked if he could call us back because he was talking with his doctor on the other line.   I let him know that would be fine to call us back.

## 2022-08-18 NOTE — Telephone Encounter (Signed)
Message from Sharene Skeans sent at 08/18/2022 10:26 AM EST  Summary: no energy   Pt has no energy and just wants to sleep / pt was self diagnosed with the flu last Wednesday / please advise          Call History   Type Contact Phone/Fax User  08/18/2022 10:26 AM EST Phone ([address]) [place], [place] (Self) [phone] Jerilynn Mages) Sharene Skeans

## 2022-08-18 NOTE — Progress Notes (Signed)
Virtual Visit Consent   Marilyn Robertson, you are scheduled for a virtual visit with Mary-Margaret Hassell Done, [place], a [facility] provider, today.     Just as with appointments in the office, your consent must be obtained to participate.  Your consent will be active for this visit and any virtual visit you may have with one of our providers in the next 365 days.     If you have a MyChart account, a copy of this consent can be sent to you electronically.  All virtual visits are billed to your insurance company just like a traditional visit in the office.    As this is a virtual visit, video technology does not allow for your provider to perform a traditional examination.  This may limit your provider's ability to fully assess your condition.  If your provider identifies any concerns that need to be evaluated in person or the need to arrange testing (such as labs, EKG, etc.), we will make arrangements to do so.     Although advances in technology are sophisticated, we cannot ensure that it will always work on either your end or our end.  If the connection with a video visit is poor, the visit may have to be switched to a telephone visit.  With either a video or telephone visit, we are not always able to ensure that we have a secure connection.     I need to obtain your verbal consent now.   Are you willing to proceed with your visit today? YES   Marilyn Robertson has provided verbal consent on 08/18/2022 for a virtual visit (video or telephone).   Mary-Margaret Hassell Done, FNP   Date: 08/18/2022 12:12 PM   Virtual Visit via Video Note   I, Mary-Margaret Hassell Done, connected with Marilyn Robertson ([id number], 09/09/1970) on 08/18/22 at 12:30 PM EST by a video-enabled telemedicine application and verified that I am speaking with the correct person using two identifiers.  Location: Patient: Virtual Visit Location Patient: Home Provider: Virtual Visit Location Provider: Mobile   I discussed the  limitations of evaluation and management by telemedicine and the availability of in person appointments. The patient expressed understanding and agreed to proceed.    History of Present Illness: Marilyn Robertson is a 52 y.o. who identifies as a female who was assigned female at birth, and is being seen today for fatigue and cough.  HPI: Had flu last week. She tok covid test and was negative, she just assumed she had flu. She had low grade fevver 99-101. Had body ache. She wored Monday and was doing well. She got up yesterday ti goto work and got ready. She sat down and fell asleep. She still as some congestion and cough. Productive but no more fever.   Cough This is a new problem. The current episode started 1 to 4 weeks ago. The cough is Productive of sputum. Associated symptoms include ear congestion and rhinorrhea. Pertinent negatives include no rash. She has tried OTC cough suppressant for the symptoms. The treatment provided mild relief.    Review of Systems  HENT:  Positive for rhinorrhea.   Respiratory:  Positive for cough.   Skin:  Negative for rash.    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, not intractable 09/14/2020   S/P total hysterectomy 07/30/2020   Pre-diabetes 07/07/2015   Major depression single episode, in partial remission (  [place]) 06/29/2015   Acid reflux 06/29/2015   Carpal tunnel syndrome 06/29/2015   Hypothyroidism 06/29/2015   Paroxysmal digital cyanosis 06/29/2015   OSA (obstructive sleep apnea) 06/29/2015   Hyperlipidemia, mild 06/29/2015   History of endometrial cancer 12/27/2014    Allergies:  Allergies  Allergen Reactions   Bee Venom Shortness Of Breath and Swelling   Topiramate Swelling, Anxiety and Rash    "severe watery eyes"; "swelling and reddening of face"    Medications:  Current Outpatient Medications:    butalbital-acetaminophen-caffeine  (FIORICET) 50-325-40 MG tablet, Take 1 tablet by mouth every 6 (six) hours as needed. , Disp: , Rfl:    calcium-vitamin D (OSCAL WITH D) 500-200 MG-UNIT tablet, Take 1 tablet by mouth., Disp: , Rfl:    cetirizine (ZYRTEC) 10 MG chewable tablet, Chew 10 mg by mouth daily., Disp: , Rfl:    clonazePAM (KLONOPIN) 1 MG tablet, Take 1 mg by mouth daily as needed., Disp: , Rfl:    cyclobenzaprine (FLEXERIL) 10 MG tablet, Take 1 tablet by mouth three times daily as needed for muscle spasm, Disp: 30 tablet, Rfl: 0   DULoxetine (CYMBALTA) 60 MG capsule, Take 60 mg by mouth daily., Disp: , Rfl:    Galcanezumab-gnlm (EMGALITY) 120 MG/ML SOSY, Inject 120 mg into the skin every 30 (thirty) days., Disp: , Rfl:    ibuprofen (ADVIL) 600 MG tablet, Take 1 tablet (600 mg total) by mouth every 6 (six) hours as needed., Disp: 30 tablet, Rfl: 0   levothyroxine (SYNTHROID) 100 MCG tablet, Take 100 mcg by mouth daily., Disp: , Rfl:    liothyronine (CYTOMEL) 5 MCG tablet, Take 5 mcg by mouth daily., Disp: , Rfl:    meloxicam (MOBIC) 15 MG tablet, Take 1 tablet by mouth once daily, Disp: 30 tablet, Rfl: 0   propranolol ER (INDERAL LA) 60 MG 24 hr capsule, Take 1 capsule by mouth daily., Disp: , Rfl:    rizatriptan (MAXALT) 10 MG tablet, Take 10 mg by mouth as needed for migraine. May repeat in 2 hours if needed, Disp: , Rfl:    Selenium 200 MCG CAPS, Take 1 capsule by mouth daily., Disp: , Rfl:    vitamin B-12 (CYANOCOBALAMIN) 500 MCG tablet, Take 500 mcg by mouth daily., Disp: , Rfl:    zinc gluconate 50 MG tablet, Take 50 mg by mouth daily., Disp: , Rfl:   Observations/Objective: Patient is well-developed, well-nourished in no acute distress.  Resting comfortably  at home.  Head is normocephalic, atraumatic.  No labored breathing.  Speech is clear and coherent with logical content.  Patient is alert and oriented at baseline.  Mainly fatigue  Assessment and Plan:  Marilyn Robertson in today with chief complaint of  Fatigue and Cough   1. Other fatigue Rest  'force fluids Continue OTC cough meds RTO prn Just going t take several days to gain strength back    Follow Up Instructions: I discussed the assessment and treatment plan with the patient. The patient was provided an opportunity to ask questions and all were answered. The patient agreed with the plan and demonstrated an understanding of the instructions.  A copy of instructions were sent to the patient via MyChart.  The patient was advised to call back or seek an in-person evaluation if the symptoms worsen or if the condition fails to improve as anticipated.  Time:  I spent 11 minutes with the patient via telehealth technology discussing the above problems/concerns.    Mary-Margaret Hassell Done, FNP

## 2022-08-18 NOTE — Telephone Encounter (Signed)
Attempted to return her call.   Voicemail left to call back.

## 2022-09-21 ENCOUNTER — Encounter: Payer: Self-pay | Admitting: Internal Medicine

## 2022-09-21 ENCOUNTER — Telehealth: Payer: Self-pay | Admitting: Internal Medicine

## 2022-09-21 ENCOUNTER — Ambulatory Visit (INDEPENDENT_AMBULATORY_CARE_PROVIDER_SITE_OTHER): Payer: BC Managed Care – PPO | Admitting: Internal Medicine

## 2022-09-21 VITALS — BP 120/82 | HR 94 | Ht 62.0 in | Wt 250.0 lb

## 2022-09-21 DIAGNOSIS — G4733 Obstructive sleep apnea (adult) (pediatric): Secondary | ICD-10-CM

## 2022-09-21 DIAGNOSIS — R7303 Prediabetes: Secondary | ICD-10-CM

## 2022-09-21 DIAGNOSIS — Z1231 Encounter for screening mammogram for malignant neoplasm of breast: Secondary | ICD-10-CM

## 2022-09-21 DIAGNOSIS — M5441 Lumbago with sciatica, right side: Secondary | ICD-10-CM

## 2022-09-21 DIAGNOSIS — Z0000 Encounter for general adult medical examination without abnormal findings: Secondary | ICD-10-CM | POA: Diagnosis not present

## 2022-09-21 DIAGNOSIS — E039 Hypothyroidism, unspecified: Secondary | ICD-10-CM | POA: Diagnosis not present

## 2022-09-21 DIAGNOSIS — M26629 Arthralgia of temporomandibular joint, unspecified side: Secondary | ICD-10-CM

## 2022-09-21 DIAGNOSIS — E785 Hyperlipidemia, unspecified: Secondary | ICD-10-CM

## 2022-09-21 DIAGNOSIS — G43C0 Periodic headache syndromes in child or adult, not intractable: Secondary | ICD-10-CM

## 2022-09-21 DIAGNOSIS — F324 Major depressive disorder, single episode, in partial remission: Secondary | ICD-10-CM

## 2022-09-21 MED ORDER — CYCLOBENZAPRINE HCL 10 MG PO TABS: 10.0000 mg | ORAL_TABLET | Freq: Three times a day (TID) | ORAL | 0 refills | Status: DC | PRN

## 2022-09-21 NOTE — Assessment & Plan Note (Addendum)
On Bipap nightly with refreshing sleep Discuss Modafinil with neurology - may be able to get approval

## 2022-09-21 NOTE — Progress Notes (Signed)
Date:  09/21/2022   Name:  Marilyn Robertson   DOB:  [birth year]   MRN:  [id number]   Chief Complaint: Annual Exam Marilyn Robertson is a 52 y.o. female who presents today for her Complete Annual Exam. She feels well. She reports exercising some. She reports she is sleeping fairly well. Breast complaints - none.  Mammogram: 09/2020 [facility] Radiology scheduled DEXA: none Pap smear: discontinued Colonoscopy: 08/2017 repeat 5 yrs  Health Maintenance Due  Topic Date Due   Zoster Vaccines- Shingrix (1 of 2) Never done   MAMMOGRAM  09/22/2021   COVID-19 Vaccine (5 - 2023-24 season) 02/19/2022   COLONOSCOPY (Pts 45-1yrs Insurance coverage will need to be confirmed)  09/03/2022    Immunization History  Administered Date(s) Administered   Influenza-Unspecified 03/22/2015, 03/21/2020, 03/21/2021   PFIZER(Purple Top)SARS-COV-2 Vaccination 09/24/2019, 10/18/2019, 02/07/2020, 01/20/2021   Tdap 01/07/2020    Thyroid Problem Presents for follow-up (followed by Endo) visit. Patient reports no anxiety, constipation, diarrhea, fatigue, palpitations or tremors. Her past medical history is significant for hyperlipidemia.  Hyperlipidemia This is a chronic problem. The problem is uncontrolled. Recent lipid tests were reviewed and are high. Pertinent negatives include no chest pain or shortness of breath. Current antihyperlipidemic treatment includes diet change.  Migraine  This is a chronic problem. The problem has been unchanged. Associated symptoms include back pain. Pertinent negatives include no abdominal pain, coughing, dizziness, fever, hearing loss, tinnitus or vomiting. She has tried beta blockers (Emgality) for the symptoms.  Depression        This is a chronic problem.The problem is unchanged.  Associated symptoms include headaches.  Associated symptoms include no fatigue.  Past treatments include SNRIs - Serotonin and norepinephrine reuptake inhibitors.  Compliance with treatment is good.  Past  medical history includes thyroid problem.   OSA - last sleep study recommended Bipap 2022.  Sleeps well but still has some afternoon somnolence.   Modafinil was denied by insurance - required a sleep specialist to evaluate.  Lab Results  Component Value Date   NA 142 09/17/2021   K 4.3 09/17/2021   CO2 25 09/17/2021   GLUCOSE 110 (H) 09/17/2021   BUN 10 09/17/2021   CREATININE 0.72 09/17/2021   CALCIUM 9.3 09/17/2021   EGFR 100 09/17/2021   GFRNONAA >60 06/08/2017   Lab Results  Component Value Date   CHOL 210 (H) 09/17/2021   HDL 50 09/17/2021   LDLCALC 144 (H) 09/17/2021   TRIG 88 09/17/2021   CHOLHDL 4.2 09/17/2021   No results found for: "TSH" Lab Results  Component Value Date   HGBA1C 5.4 09/17/2021   Lab Results  Component Value Date   WBC 5.7 09/17/2021   HGB 12.4 09/17/2021   HCT 38.3 09/17/2021   MCV 87 09/17/2021   PLT 295 09/17/2021   Lab Results  Component Value Date   ALT 21 09/17/2021   AST 19 09/17/2021   ALKPHOS 124 (H) 09/17/2021   BILITOT 0.3 09/17/2021   No results found for: "25OHVITD2", "25OHVITD3", "VD25OH"   Review of Systems  Constitutional:  Negative for chills, fatigue and fever.  HENT:  Negative for congestion, hearing loss, tinnitus, trouble swallowing and voice change.   Eyes:  Negative for visual disturbance.  Respiratory:  Negative for cough, chest tightness, shortness of breath and wheezing.   Cardiovascular:  Negative for chest pain, palpitations and leg swelling.  Gastrointestinal:  Negative for abdominal pain, constipation, diarrhea and vomiting.  Endocrine: Negative for polydipsia and polyuria.  Genitourinary:  Negative for dysuria, frequency, genital sores, vaginal bleeding and vaginal discharge.  Musculoskeletal:  Positive for arthralgias and back pain. Negative for gait problem and joint swelling.       Back and jaw pain  Skin:  Negative for color change and rash.  Neurological:  Positive for headaches. Negative for  dizziness, tremors and light-headedness.  Hematological:  Negative for adenopathy. Does not bruise/bleed easily.  Psychiatric/Behavioral:  Positive for depression and sleep disturbance. Negative for dysphoric mood. The patient is not nervous/anxious.     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, not intractable 09/14/2020   S/P total hysterectomy 07/30/2020   Pre-diabetes 07/07/2015   Major depression single episode, in partial remission 06/29/2015   Acid reflux 06/29/2015   Carpal tunnel syndrome 06/29/2015   Hypothyroidism 06/29/2015   Paroxysmal digital cyanosis 06/29/2015   OSA (obstructive sleep apnea) 06/29/2015   Hyperlipidemia, mild 06/29/2015   History of endometrial cancer 12/27/2014    Allergies  Allergen Reactions   Bee Venom Shortness Of Breath and Swelling   Topiramate Swelling, Anxiety and Rash    "severe watery eyes"; "swelling and reddening of face"     Past Surgical History:  Procedure Laterality Date   BREAST REDUCTION SURGERY  1996   BREAST SURGERY     COLONOSCOPY WITH PROPOFOL N/A 08/24/2017   Procedure: COLONOSCOPY WITH PROPOFOL;  Surgeon: Tol[place], Benay Pike, MD;  Location: [facility] ENDOSCOPY;  Service: Gastroenterology;  Laterality: N/A;   ROBOTIC ASSISTED TOTAL HYSTERECTOMY  2016   atypical endometrial hyperplasia   TOTAL ABDOMINAL HYSTERECTOMY      Social History   Tobacco Use   Smoking status: Never   Smokeless tobacco: Never  Vaping Use   Vaping Use: Never used  Substance Use Topics   Alcohol use: Yes    Alcohol/week: 2.0 standard drinks of alcohol    Types: 2 Standard drinks or equivalent per week    Comment: occasional   Drug use: No     Medication list has been reviewed and updated.  Current Meds  Medication Sig   butalbital-acetaminophen-caffeine (FIORICET) 50-325-40 MG tablet Take 1 tablet by mouth every 6 (six) hours as needed.     calcium-vitamin D (OSCAL WITH D) 500-200 MG-UNIT tablet Take 1 tablet by mouth.   cetirizine (ZYRTEC) 10 MG chewable tablet Chew 10 mg by mouth daily.   clonazePAM (KLONOPIN) 1 MG tablet Take 1 mg by mouth daily as needed.   DULoxetine (CYMBALTA) 60 MG capsule Take 60 mg by mouth daily.   gabapentin (NEURONTIN) 100 MG capsule Take 100 mg by mouth daily.   Galcanezumab-gnlm (EMGALITY) 120 MG/ML SOSY Inject 120 mg into the skin every 30 (thirty) days.   ibuprofen (ADVIL) 600 MG tablet Take 1 tablet (600 mg total) by mouth every 6 (six) hours as needed.   levothyroxine (SYNTHROID) 100 MCG tablet Take 100 mcg by mouth daily.   liothyronine (CYTOMEL) 5 MCG tablet Take 5 mcg by mouth daily.   meloxicam (MOBIC) 15 MG tablet Take 1 tablet by mouth once daily   propranolol ER (INDERAL LA) 60 MG 24 hr capsule Take 1 capsule by mouth daily.   rizatriptan (MAXALT) 10 MG tablet Take 10 mg by mouth as needed for migraine. May repeat in 2 hours if needed   Selenium 200 MCG CAPS Take 1 capsule by mouth daily.   vitamin B-12 (CYANOCOBALAMIN) 500 MCG tablet Take 500 mcg  by mouth daily.   zinc gluconate 50 MG tablet Take 50 mg by mouth daily.   [DISCONTINUED] cyclobenzaprine (FLEXERIL) 10 MG tablet Take 1 tablet by mouth three times daily as needed for muscle spasm       09/21/2022    8:23 AM 02/08/2022    3:14 PM 12/25/2021   11:27 AM 09/17/2021    9:49 AM  GAD 7 : Generalized Anxiety Score  Nervous, Anxious, on Edge 1 1 1  0  Control/stop worrying 1 1 1  0  Worry too much - different things 1 1 1  0  Trouble relaxing 2 2 1  0  Restless 1 1 1  0  Easily annoyed or irritable 1 1 1  0  Afraid - awful might happen 0 0 1 0  Total GAD 7 Score 7 7 7  0  Anxiety Difficulty Not difficult at all Not difficult at all Somewhat difficult        09/21/2022    8:22 AM 02/08/2022    3:14 PM 12/25/2021   11:27 AM  Depression screen PHQ 2/9  Decreased Interest 1 0 1  Down, Depressed, Hopeless 1 1 1   PHQ - 2 Score 2 1 2    Altered sleeping 3 1 2   Tired, decreased energy 3 3 2   Change in appetite 1 1 0  Feeling bad or failure about yourself  0 1 0  Trouble concentrating 2 1 1   Moving slowly or fidgety/restless 1 2 2   Suicidal thoughts 0 0 0  PHQ-9 Score 12 10 9   Difficult doing work/chores Very difficult Somewhat difficult Somewhat difficult    BP Readings from Last 3 Encounters:  09/21/22 120/82  02/08/22 132/88  12/25/21 136/70    Physical Exam Vitals and nursing note reviewed.  Constitutional:      General: She is not in acute distress.    Appearance: She is well-developed.  HENT:     Head: Normocephalic and atraumatic.     Right Ear: Tympanic membrane and ear canal normal.     Left Ear: Tympanic membrane and ear canal normal.     Nose:     Right Sinus: No maxillary sinus tenderness.     Left Sinus: No maxillary sinus tenderness.  Eyes:     General: No scleral icterus.       Right eye: No discharge.        Left eye: No discharge.     Conjunctiva/sclera: Conjunctivae normal.  Neck:     Thyroid: No thyromegaly.     Vascular: No carotid bruit.  Cardiovascular:     Rate and Rhythm: Normal rate and regular rhythm.     Pulses: Normal pulses.     Heart sounds: Normal heart sounds.  Pulmonary:     Effort: Pulmonary effort is normal. No respiratory distress.     Breath sounds: No wheezing.  Chest:     Comments: Breast exam deferred to GYN Abdominal:     General: Bowel sounds are normal.     Palpations: Abdomen is soft.     Tenderness: There is no abdominal tenderness.  Musculoskeletal:     Cervical back: Normal range of motion. No erythema.     Right lower leg: No edema.     Left lower leg: No edema.  Lymphadenopathy:     Cervical: No cervical adenopathy.  Skin:    General: Skin is warm and dry.     Findings: No rash.  Neurological:     Mental Status: She is alert and oriented  to person, place, and time.     Cranial Nerves: No cranial nerve deficit.     Sensory: No sensory  deficit.     Deep Tendon Reflexes: Reflexes are normal and symmetric.  Psychiatric:        Attention and Perception: Attention normal.        Mood and Affect: Mood normal.     Wt Readings from Last 3 Encounters:  09/21/22 250 lb (113.4 kg)  02/08/22 252 lb 3.2 oz (114.4 kg)  12/25/21 249 lb (112.9 kg)    BP 120/82   Pulse 94   Ht 5\' 2"  (1.575 m)   Wt 250 lb (113.4 kg)   SpO2 97%   BMI 45.73 kg/m   Assessment and Plan:  Problem List Items Addressed This Visit       Cardiovascular and Mediastinum   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, not intractable (Chronic)    Migraine well controlled on Emgality and cymbalta      Relevant Medications   gabapentin (NEURONTIN) 100 MG capsule   cyclobenzaprine (FLEXERIL) 10 MG tablet     Respiratory   OSA (obstructive sleep apnea) (Chronic)    On Bipap nightly with refreshing sleep Discuss Modafinil with neurology - may be able to get approval      Relevant Orders   CBC with Differential/Platelet     Endocrine   Hypothyroidism (Chronic)    Followed by endocrinology         Musculoskeletal and Integument   TMJ syndrome    Using Flexeril qhs prn        Other   Hyperlipidemia, mild (Chronic)    Diet control only at this time.      Relevant Orders   Lipid panel   Major depression single episode, in partial remission (Chronic)    Clinically stable on current regimen with good control of symptoms, No SI or HI. No change in management at this time. Continue Cymbalta      Pre-diabetes (Chronic)    Controlled with diet only.      Relevant Orders   Hemoglobin A1c   Other Visit Diagnoses     Annual physical exam    -  Primary   Normal exam other than weight consider Shingrix vaccine   Relevant Orders   CBC with Differential/Platelet   Comprehensive metabolic panel   Lipid panel   Hemoglobin A1c   Encounter for screening mammogram for breast cancer       scheduled next month at [facility]  Radiology   Acute right-sided low back pain with right-sided sciatica       Relevant Medications   gabapentin (NEURONTIN) 100 MG capsule   cyclobenzaprine (FLEXERIL) 10 MG tablet       Return in about 1 year (around 09/21/2023) for CPX.   Partially dictated using Wallace, any errors are not intentional.  Glean Hess, MD [place], [place]

## 2022-09-21 NOTE — Assessment & Plan Note (Signed)
Followed by endocrinology 

## 2022-09-21 NOTE — Assessment & Plan Note (Signed)
Clinically stable on current regimen with good control of symptoms, No SI or HI. No change in management at this time. Continue Cymbalta

## 2022-09-21 NOTE — Assessment & Plan Note (Signed)
Migraine well controlled on Emgality and cymbalta

## 2022-09-21 NOTE — Assessment & Plan Note (Signed)
Controlled with diet only.

## 2022-09-21 NOTE — Assessment & Plan Note (Signed)
Using Flexeril qhs prn

## 2022-09-21 NOTE — Assessment & Plan Note (Signed)
Diet control only at this time.

## 2022-09-21 NOTE — Telephone Encounter (Signed)
[facility] is calling in because they received a medical records request for the pt's most recent mammogram. [facility] states the pt hasn't had a mammogram done at their office.

## 2022-09-22 LAB — CBC WITH DIFFERENTIAL/PLATELET
Basophils Absolute: 0 10*3/uL (ref 0.0–0.2)
Basos: 1 %
EOS (ABSOLUTE): 0.2 10*3/uL (ref 0.0–0.4)
Eos: 2 %
Hematocrit: 38.6 % (ref 34.0–46.6)
Hemoglobin: 12.7 g/dL (ref 11.1–15.9)
Immature Grans (Abs): 0 10*3/uL (ref 0.0–0.1)
Immature Granulocytes: 0 %
Lymphocytes Absolute: 2.2 10*3/uL (ref 0.7–3.1)
Lymphs: 29 %
MCH: 29.3 pg (ref 26.6–33.0)
MCHC: 32.9 g/dL (ref 31.5–35.7)
MCV: 89 fL (ref 79–97)
Monocytes Absolute: 0.6 10*3/uL (ref 0.1–0.9)
Monocytes: 8 %
Neutrophils Absolute: 4.6 10*3/uL (ref 1.4–7.0)
Neutrophils: 60 %
Platelets: 317 10*3/uL (ref 150–450)
RBC: 4.34 x10E6/uL (ref 3.77–5.28)
RDW: 12.2 % (ref 11.7–15.4)
WBC: 7.7 10*3/uL (ref 3.4–10.8)

## 2022-09-22 LAB — COMPREHENSIVE METABOLIC PANEL
ALT: 20 IU/L (ref 0–32)
AST: 20 IU/L (ref 0–40)
Albumin/Globulin Ratio: 1.5 (ref 1.2–2.2)
Albumin: 4.1 g/dL (ref 3.8–4.9)
Alkaline Phosphatase: 121 IU/L (ref 44–121)
BUN/Creatinine Ratio: 12 (ref 9–23)
BUN: 9 mg/dL (ref 6–24)
Bilirubin Total: 0.3 mg/dL (ref 0.0–1.2)
CO2: 20 mmol/L (ref 20–29)
Calcium: 9.2 mg/dL (ref 8.7–10.2)
Chloride: 99 mmol/L (ref 96–106)
Creatinine, Ser: 0.77 mg/dL (ref 0.57–1.00)
Globulin, Total: 2.7 g/dL (ref 1.5–4.5)
Glucose: 102 mg/dL — ABNORMAL HIGH (ref 70–99)
Potassium: 4.4 mmol/L (ref 3.5–5.2)
Sodium: 136 mmol/L (ref 134–144)
Total Protein: 6.8 g/dL (ref 6.0–8.5)
eGFR: 92 mL/min/{1.73_m2} (ref >59)

## 2022-09-22 LAB — LIPID PANEL
Chol/HDL Ratio: 4.1 ratio (ref 0.0–4.4)
Cholesterol, Total: 219 mg/dL — ABNORMAL HIGH (ref 100–199)
HDL: 53 mg/dL (ref >39)
LDL Chol Calc (NIH): 149 mg/dL — ABNORMAL HIGH (ref 0–99)
Triglycerides: 96 mg/dL (ref 0–149)
VLDL Cholesterol Cal: 17 mg/dL (ref 5–40)

## 2022-09-22 LAB — HEMOGLOBIN A1C
Est. average glucose Bld gHb Est-mCnc: 114 mg/dL
Hgb A1c MFr Bld: 5.6 % (ref 4.8–5.6)

## 2022-11-23 LAB — HM MAMMOGRAPHY

## 2022-12-15 ENCOUNTER — Other Ambulatory Visit: Payer: Self-pay | Admitting: Internal Medicine

## 2022-12-15 DIAGNOSIS — M5441 Lumbago with sciatica, right side: Secondary | ICD-10-CM

## 2022-12-17 ENCOUNTER — Encounter: Payer: Self-pay | Admitting: Internal Medicine

## 2023-01-11 ENCOUNTER — Ambulatory Visit: Payer: BC Managed Care – PPO | Admitting: Internal Medicine

## 2023-01-11 ENCOUNTER — Encounter: Payer: Self-pay | Admitting: Internal Medicine

## 2023-01-11 ENCOUNTER — Ambulatory Visit: Payer: Self-pay

## 2023-01-11 VITALS — BP 128/86 | HR 72 | Ht 62.0 in | Wt 252.0 lb

## 2023-01-11 DIAGNOSIS — H1033 Unspecified acute conjunctivitis, bilateral: Secondary | ICD-10-CM

## 2023-01-11 MED ORDER — NEOMYCIN-POLYMYXIN-DEXAMETH 3.5-10000-0.1 OP SUSP: 2.0000 [drp] | Freq: Four times a day (QID) | OPHTHALMIC | 0 refills | Status: DC

## 2023-01-11 NOTE — Telephone Encounter (Signed)
Summary: possible pink eye-crusty eyes, blurred vision, eyes are red.   Pt stated possible pink eye asking is asking if something can be called in. Stated symptoms started yesterday crusty eyes, blurred vision, eyes are red.  Seeking clinical advice.     Chief Complaint: Both eyes are red, draining yellow pus. Symptoms: Above Frequency: Yesterday Pertinent Negatives: Patient denies any other symptoms Disposition: [] ED /[] Urgent Care (no appt availability in office) / [x] Appointment(In office/virtual)/ []  [place] [facility]/ [] Home Care/ [] Refused Recommended Disposition /[] [place] [facility]/ []  Follow-up with PCP Additional Notes: Pt. Agrees with appointment.  Reason for Disposition  Blurred vision  Answer Assessment - Initial Assessment Questions 1. EYE DISCHARGE: "Is the discharge in one or both eyes?" "What color is it?" "How much is there?" "When did the discharge start?"      Both 2. REDNESS OF SCLERA: "Is the redness in one or both eyes?" "When did the redness start?"      Yes 3. EYELIDS: "Are the eyelids red or swollen?" If Yes, ask: "How much?"      No 4. VISION: "Is there any difficulty seeing clearly?"      Blurry at times 5. PAIN: "Is there any pain? If Yes, ask: "How bad is it?" (Scale 1-10; or mild, moderate, severe)    - MILD (1-3): doesn't interfere with normal activities     - MODERATE (4-7): interferes with normal activities or awakens from sleep    - SEVERE (8-10): excruciating pain, unable to do any normal activities       Mild 6. CONTACT LENS: "Do you wear contacts?"     No 7. OTHER SYMPTOMS: "Do you have any other symptoms?" (e.g., fever, runny nose, cough)     No 8. PREGNANCY: "Is there any chance you are pregnant?" "When was your last menstrual period?"     No  Protocols used: Eye - Pus or Discharge-A-AH

## 2023-01-11 NOTE — Progress Notes (Signed)
Date:  01/11/2023   Name:  KADI HESSION   DOB:  1970-08-02   MRN:  [id number]   Chief Complaint: Conjunctivitis (Itchy eyes, drainage from both eyes. Started last night. Thinks she caught this at the [employer]. )  Conjunctivitis  The current episode started yesterday. The onset was sudden. The problem occurs continuously. The problem has been unchanged. The problem is moderate. Associated symptoms include eye itching, eye discharge and eye redness. Pertinent negatives include no fever.    Lab Results  Component Value Date   NA 136 09/21/2022   K 4.4 09/21/2022   CO2 20 09/21/2022   GLUCOSE 102 (H) 09/21/2022   BUN 9 09/21/2022   CREATININE 0.77 09/21/2022   CALCIUM 9.2 09/21/2022   EGFR 92 09/21/2022   GFRNONAA >60 06/08/2017   Lab Results  Component Value Date   CHOL 219 (H) 09/21/2022   HDL 53 09/21/2022   LDLCALC 149 (H) 09/21/2022   TRIG 96 09/21/2022   CHOLHDL 4.1 09/21/2022   No results found for: "TSH" Lab Results  Component Value Date   HGBA1C 5.6 09/21/2022   Lab Results  Component Value Date   WBC 7.7 09/21/2022   HGB 12.7 09/21/2022   HCT 38.6 09/21/2022   MCV 89 09/21/2022   PLT 317 09/21/2022   Lab Results  Component Value Date   ALT 20 09/21/2022   AST 20 09/21/2022   ALKPHOS 121 09/21/2022   BILITOT 0.3 09/21/2022   No results found for: "25OHVITD2", "25OHVITD3", "VD25OH"   Review of Systems  Constitutional:  Negative for chills, fatigue and fever.  Eyes:  Positive for discharge, redness and itching.  Respiratory:  Negative for chest tightness and shortness of breath.   Cardiovascular:  Negative for chest pain.    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, not intractable 09/14/2020   S/P total hysterectomy 07/30/2020   Pre-diabetes 07/07/2015   Major depression single episode, in partial remission (HCC) 06/29/2015   Acid reflux  06/29/2015   Carpal tunnel syndrome 06/29/2015   Hypothyroidism 06/29/2015   Paroxysmal digital cyanosis 06/29/2015   OSA (obstructive sleep apnea) 06/29/2015   Hyperlipidemia, mild 06/29/2015   History of endometrial cancer 12/27/2014    Allergies  Allergen Reactions   Bee Venom Shortness Of Breath and Swelling   Topiramate Swelling, Anxiety and Rash    "severe watery eyes"; "swelling and reddening of face"     Past Surgical History:  Procedure Laterality Date   BREAST REDUCTION SURGERY  1996   BREAST SURGERY     COLONOSCOPY WITH PROPOFOL N/A 08/24/2017   Procedure: COLONOSCOPY WITH PROPOFOL;  Surgeon: Toledo, Boykin Nearing, MD;  Location: [facility] ENDOSCOPY;  Service: Gastroenterology;  Laterality: N/A;   ROBOTIC ASSISTED TOTAL HYSTERECTOMY  2016   atypical endometrial hyperplasia   TOTAL ABDOMINAL HYSTERECTOMY      Social History   Tobacco Use   Smoking status: Never   Smokeless tobacco: Never  Vaping Use   Vaping status: Never Used  Substance Use Topics   Alcohol use: Yes    Alcohol/week: 2.0 standard drinks of alcohol    Types: 2 Standard drinks or equivalent per week    Comment: occasional   Drug use: No     Medication list has been reviewed and updated.  Current Meds  Medication Sig   butalbital-acetaminophen-caffeine (FIORICET) 50-325-40 MG tablet Take 1 tablet by mouth every 6 (  six) hours as needed.    calcium-vitamin D (OSCAL WITH D) 500-200 MG-UNIT tablet Take 1 tablet by mouth.   cetirizine (ZYRTEC) 10 MG chewable tablet Chew 10 mg by mouth daily.   clonazePAM (KLONOPIN) 1 MG tablet Take 1 mg by mouth daily as needed.   cyclobenzaprine (FLEXERIL) 10 MG tablet TAKE 1 TABLET BY MOUTH THREE TIMES DAILY AS NEEDED FOR  MUSCLE  SPASMS   DULoxetine (CYMBALTA) 60 MG capsule Take 60 mg by mouth daily.   gabapentin (NEURONTIN) 100 MG capsule Take 100 mg by mouth daily.   Galcanezumab-gnlm (EMGALITY) 120 MG/ML SOSY Inject 120 mg into the skin every 30 (thirty) days.    ibuprofen (ADVIL) 600 MG tablet Take 1 tablet (600 mg total) by mouth every 6 (six) hours as needed.   levothyroxine (SYNTHROID) 100 MCG tablet Take 100 mcg by mouth daily.   liothyronine (CYTOMEL) 5 MCG tablet Take 5 mcg by mouth daily.   meloxicam (MOBIC) 15 MG tablet Take 1 tablet by mouth once daily   neomycin-polymyxin b-dexamethasone (MAXITROL) 3.5-10000-0.1 SUSP Place 2 drops into both eyes every 6 (six) hours.   propranolol ER (INDERAL LA) 60 MG 24 hr capsule Take 1 capsule by mouth daily.   rizatriptan (MAXALT) 10 MG tablet Take 10 mg by mouth as needed for migraine. May repeat in 2 hours if needed   Selenium 200 MCG CAPS Take 1 capsule by mouth daily.   vitamin B-12 (CYANOCOBALAMIN) 500 MCG tablet Take 500 mcg by mouth daily.   zinc gluconate 50 MG tablet Take 50 mg by mouth daily.       01/11/2023   11:23 AM 09/21/2022    8:23 AM 02/08/2022    3:14 PM 12/25/2021   11:27 AM  GAD 7 : Generalized Anxiety Score  Nervous, Anxious, on Edge 0 1 1 1   Control/stop worrying 0 1 1 1   Worry too much - different things 0 1 1 1   Trouble relaxing 1 2 2 1   Restless 0 1 1 1   Easily annoyed or irritable 1 1 1 1   Afraid - awful might happen 0 0 0 1  Total GAD 7 Score 2 7 7 7   Anxiety Difficulty Not difficult at all Not difficult at all Not difficult at all Somewhat difficult       01/11/2023   11:23 AM 09/21/2022    8:22 AM 02/08/2022    3:14 PM  Depression screen PHQ 2/9  Decreased Interest 1 1 0  Down, Depressed, Hopeless 1 1 1   PHQ - 2 Score 2 2 1   Altered sleeping 1 3 1   Tired, decreased energy 1 3 3   Change in appetite 0 1 1  Feeling bad or failure about yourself  1 0 1  Trouble concentrating 2 2 1   Moving slowly or fidgety/restless 0 1 2  Suicidal thoughts 0 0 0  PHQ-9 Score 7 12 10   Difficult doing work/chores Not difficult at all Very difficult Somewhat difficult    BP Readings from Last 3 Encounters:  01/11/23 128/86  09/21/22 120/82  02/08/22 132/88    Physical  Exam Vitals and nursing note reviewed.  Constitutional:      General: She is not in acute distress.    Appearance: Normal appearance. She is well-developed. She is not ill-appearing.  HENT:     Head: Normocephalic and atraumatic.  Eyes:     General: Lids are normal.     Extraocular Movements: Extraocular movements intact.     Conjunctiva/sclera:  Right eye: Right conjunctiva is injected. Chemosis present.     Left eye: Left conjunctiva is injected. Chemosis present.  Cardiovascular:     Rate and Rhythm: Normal rate and regular rhythm.  Pulmonary:     Effort: Pulmonary effort is normal. No respiratory distress.     Breath sounds: No wheezing or rhonchi.  Skin:    General: Skin is warm and dry.     Findings: No rash.  Neurological:     Mental Status: She is alert and oriented to person, place, and time.  Psychiatric:        Mood and Affect: Mood normal.        Behavior: Behavior normal.     Wt Readings from Last 3 Encounters:  01/11/23 252 lb (114.3 kg)  09/21/22 250 lb (113.4 kg)  02/08/22 252 lb 3.2 oz (114.4 kg)    BP 128/86 (BP Location: Right Arm, Cuff Size: Large)   Pulse 72   Ht 5\' 2"  (1.575 m)   Wt 252 lb (114.3 kg)   SpO2 95%   BMI 46.09 kg/m   Assessment and Plan:  Problem List Items Addressed This Visit   None Visit Diagnoses     Acute bacterial conjunctivitis of both eyes    -  Primary   Relevant Medications   neomycin-polymyxin b-dexamethasone (MAXITROL) 3.5-10000-0.1 SUSP       No follow-ups on file.    Reubin Milan, MD [facility] [place]

## 2023-08-17 ENCOUNTER — Other Ambulatory Visit: Payer: Self-pay | Admitting: Internal Medicine

## 2023-08-17 DIAGNOSIS — M5441 Lumbago with sciatica, right side: Secondary | ICD-10-CM

## 2023-08-17 NOTE — Telephone Encounter (Signed)
 Requested medication (s) are due for refill today: Yes  Requested medication (s) are on the active medication list: Yes  Last refill:  12/15/22  Future visit scheduled: Yes  Notes to clinic:  Unable to refill per protocol, cannot delegate.      Requested Prescriptions  Pending Prescriptions Disp Refills   cyclobenzaprine (FLEXERIL) 10 MG tablet [Pharmacy Med Name: Cyclobenzaprine HCl 10 MG Oral Tablet] 30 tablet 0    Sig: Take 1 tablet by mouth three times daily as needed for muscle spasm     Not Delegated - Analgesics:  Muscle Relaxants Failed - 08/17/2023  2:48 PM      Failed - This refill cannot be delegated      Failed - Valid encounter within last 6 months    Recent Outpatient Visits           7 months ago Acute bacterial conjunctivitis of both eyes   [place] [facility] at [facility], Nyoka Cowden, MD   11 months ago Annual physical exam   [facility] at [facility], Nyoka Cowden, MD   1 year ago Acute right-sided low back pain with right-sided sciatica   [place] [facility] at [facility], Nyoka Cowden, MD   1 year ago Acute right-sided low back pain with right-sided sciatica    Primary Care & Sports Medicine at [facility], Nyoka Cowden, MD   1 year ago OSA (obstructive sleep apnea)   [facility] at [facility], Nyoka Cowden, MD       Future Appointments             In 1 month Judithann Graves, Nyoka Cowden, MD [facility] at [facility]

## 2023-09-23 ENCOUNTER — Encounter: Payer: Self-pay | Admitting: Internal Medicine

## 2023-09-27 ENCOUNTER — Ambulatory Visit: Payer: Self-pay | Admitting: Internal Medicine

## 2023-09-27 ENCOUNTER — Encounter: Payer: Self-pay | Admitting: Internal Medicine

## 2023-09-27 VITALS — BP 126/84 | HR 106 | Ht 62.0 in | Wt 252.2 lb

## 2023-09-27 DIAGNOSIS — E890 Postprocedural hypothyroidism: Secondary | ICD-10-CM | POA: Diagnosis not present

## 2023-09-27 DIAGNOSIS — Z6841 Body Mass Index (BMI) 40.0 and over, adult: Secondary | ICD-10-CM | POA: Insufficient documentation

## 2023-09-27 DIAGNOSIS — G4733 Obstructive sleep apnea (adult) (pediatric): Secondary | ICD-10-CM

## 2023-09-27 DIAGNOSIS — G43C0 Periodic headache syndromes in child or adult, not intractable: Secondary | ICD-10-CM

## 2023-09-27 DIAGNOSIS — Z0000 Encounter for general adult medical examination without abnormal findings: Secondary | ICD-10-CM

## 2023-09-27 DIAGNOSIS — Z1231 Encounter for screening mammogram for malignant neoplasm of breast: Secondary | ICD-10-CM | POA: Diagnosis not present

## 2023-09-27 DIAGNOSIS — E785 Hyperlipidemia, unspecified: Secondary | ICD-10-CM

## 2023-09-27 DIAGNOSIS — F324 Major depressive disorder, single episode, in partial remission: Secondary | ICD-10-CM

## 2023-09-27 DIAGNOSIS — Z1211 Encounter for screening for malignant neoplasm of colon: Secondary | ICD-10-CM

## 2023-09-27 DIAGNOSIS — R7303 Prediabetes: Secondary | ICD-10-CM

## 2023-09-27 NOTE — Progress Notes (Signed)
 Date:  09/27/2023   Name:  Marilyn Robertson   DOB:  1971/04/14   MRN:  [id number]   Chief Complaint: Annual Exam Marilyn Robertson is a 53 y.o. female who presents today for her Complete Annual Exam. She feels well. She reports exercising none. She reports she is sleeping fairly well. Breast complaints none. She still sees GYN for mammogram order.  She has paperwork for colonoscopy but has not scheduled due to issues caring for elderly mother.    Health Maintenance  Topic Date Due   Zoster (Shingles) Vaccine (1 of 2) Never done   Colon Cancer Screening  09/03/2022   COVID-19 Vaccine (6 - 2024-25 season) 10/13/2023*   Mammogram  11/23/2023   Flu Shot  01/20/2024   DTaP/Tdap/Td vaccine (2 - Td or Tdap) 01/06/2030   Hepatitis C Screening  Completed   HIV Screening  Completed   HPV Vaccine  Aged Out  *Topic was postponed. The date shown is not the original due date.    Thyroid Problem Patient reports no anxiety, constipation, diarrhea, fatigue, palpitations or tremors. Her past medical history is significant for hyperlipidemia.  Diabetes Hypoglycemia symptoms include headaches. Pertinent negatives for hypoglycemia include no dizziness, nervousness/anxiousness or tremors. Pertinent negatives for diabetes include no chest pain, no fatigue, no polydipsia and no polyuria.  Hyperlipidemia This is a chronic problem. Recent lipid tests were reviewed and are high. Pertinent negatives include no chest pain or shortness of breath.  Migraine  This is a recurrent problem. The problem has been gradually improving. Associated symptoms include tinnitus. Pertinent negatives include no abdominal pain, coughing, dizziness, fever, hearing loss or vomiting. Treatments tried: on Emgality, gabapentin, Maxalt.    Review of Systems  Constitutional:  Negative for chills, fatigue and fever.  HENT:  Positive for tinnitus. Negative for congestion, hearing loss, trouble swallowing and voice change.   Eyes:   Negative for visual disturbance.  Respiratory:  Negative for cough, chest tightness, shortness of breath and wheezing.   Cardiovascular:  Negative for chest pain, palpitations and leg swelling.  Gastrointestinal:  Negative for abdominal pain, constipation, diarrhea and vomiting.  Endocrine: Negative for polydipsia and polyuria.  Genitourinary:  Negative for dysuria, frequency, genital sores, vaginal bleeding and vaginal discharge.  Musculoskeletal:  Negative for arthralgias, gait problem and joint swelling.  Skin:  Negative for color change and rash.  Neurological:  Positive for headaches. Negative for dizziness, tremors and light-headedness.  Hematological:  Negative for adenopathy. Does not bruise/bleed easily.  Psychiatric/Behavioral:  Negative for dysphoric mood and sleep disturbance. The patient is not nervous/anxious.      Lab Results  Component Value Date   NA 136 09/21/2022   K 4.4 09/21/2022   CO2 20 09/21/2022   GLUCOSE 102 (H) 09/21/2022   BUN 9 09/21/2022   CREATININE 0.77 09/21/2022   CALCIUM 9.2 09/21/2022   EGFR 92 09/21/2022   GFRNONAA >60 06/08/2017   Lab Results  Component Value Date   CHOL 219 (H) 09/21/2022   HDL 53 09/21/2022   LDLCALC 149 (H) 09/21/2022   TRIG 96 09/21/2022   CHOLHDL 4.1 09/21/2022   No results found for: "TSH" Lab Results  Component Value Date   HGBA1C 5.6 09/21/2022   Lab Results  Component Value Date   WBC 7.7 09/21/2022   HGB 12.7 09/21/2022   HCT 38.6 09/21/2022   MCV 89 09/21/2022   PLT 317 09/21/2022   Lab Results  Component Value Date   ALT 20 09/21/2022  AST 20 09/21/2022   ALKPHOS 121 09/21/2022   BILITOT 0.3 09/21/2022   No results found for: "25OHVITD2", "25OHVITD3", "VD25OH"   Patient Active Problem List   Diagnosis Date Noted   Adult BMI 45.0-49.9 kg/sq m (HCC) 09/27/2023   TMJ syndrome 09/15/2020   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, not intractable 09/14/2020   S/P total hysterectomy  07/30/2020   Pre-diabetes 07/07/2015   Major depression single episode, in partial remission (HCC) 06/29/2015   Acid reflux 06/29/2015   Carpal tunnel syndrome 06/29/2015   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   Paroxysmal digital cyanosis 06/29/2015   OSA (obstructive sleep apnea) 06/29/2015   Hyperlipidemia, mild 06/29/2015   History of endometrial cancer 12/27/2014    Allergies  Allergen Reactions   Bee Venom Shortness Of Breath and Swelling   Topiramate Swelling, Anxiety and Rash    "severe watery eyes"; "swelling and reddening of face"     Past Surgical History:  Procedure Laterality Date   BREAST REDUCTION SURGERY  1996   BREAST SURGERY     COLONOSCOPY WITH PROPOFOL N/A 08/24/2017   Procedure: COLONOSCOPY WITH PROPOFOL;  Surgeon: Toledo, Boykin Nearing, MD;  Location: [facility] ENDOSCOPY;  Service: Gastroenterology;  Laterality: N/A;   ROBOTIC ASSISTED TOTAL HYSTERECTOMY  2016   atypical endometrial hyperplasia   TOTAL ABDOMINAL HYSTERECTOMY      Social History   Tobacco Use   Smoking status: Never   Smokeless tobacco: Never  Vaping Use   Vaping status: Never Used  Substance Use Topics   Alcohol use: Yes    Alcohol/week: 2.0 standard drinks of alcohol    Types: 2 Standard drinks or equivalent per week    Comment: occasional   Drug use: No     Medication list has been reviewed and updated.  Current Meds  Medication Sig   butalbital-acetaminophen-caffeine (FIORICET) 50-325-40 MG tablet Take 1 tablet by mouth every 6 (six) hours as needed.    calcium-vitamin D (OSCAL WITH D) 500-200 MG-UNIT tablet Take 1 tablet by mouth.   cetirizine (ZYRTEC) 10 MG chewable tablet Chew 10 mg by mouth daily.   clonazePAM (KLONOPIN) 1 MG tablet Take 1 mg by mouth daily as needed.   cyclobenzaprine (FLEXERIL) 10 MG tablet Take 1 tablet by mouth three times daily as needed for muscle spasm   DULoxetine (CYMBALTA) 60 MG capsule Take 120 mg by mouth daily.   gabapentin (NEURONTIN) 100 MG  capsule Take 100 mg by mouth daily.   Galcanezumab-gnlm (EMGALITY) 120 MG/ML SOSY Inject 120 mg into the skin every 30 (thirty) days.   ibuprofen (ADVIL) 600 MG tablet Take 1 tablet (600 mg total) by mouth every 6 (six) hours as needed.   liothyronine (CYTOMEL) 5 MCG tablet Take 5 mcg by mouth daily.   propranolol ER (INDERAL LA) 60 MG 24 hr capsule Take 1 capsule by mouth daily.   rizatriptan (MAXALT) 10 MG tablet Take 10 mg by mouth as needed for migraine. May repeat in 2 hours if needed   vitamin B-12 (CYANOCOBALAMIN) 500 MCG tablet Take 500 mcg by mouth daily.   zinc gluconate 50 MG tablet Take 50 mg by mouth daily.   [DISCONTINUED] levothyroxine (SYNTHROID) 100 MCG tablet Take 100 mcg by mouth daily.       09/27/2023    3:49 PM 01/11/2023   11:23 AM 09/21/2022    8:23 AM 02/08/2022    3:14 PM  GAD 7 : Generalized Anxiety Score  Nervous, Anxious, on Edge 1 0 1 1  Control/stop worrying 0 0 1 1  Worry too much - different things 1 0 1 1  Trouble relaxing 1 1 2 2   Restless 0 0 1 1  Easily annoyed or irritable 1 1 1 1   Afraid - awful might happen 0 0 0 0  Total GAD 7 Score 4 2 7 7   Anxiety Difficulty Somewhat difficult Not difficult at all Not difficult at all Not difficult at all       09/27/2023    3:49 PM 01/11/2023   11:23 AM 09/21/2022    8:22 AM  Depression screen PHQ 2/9  Decreased Interest 0 1 1  Down, Depressed, Hopeless 0 1 1  PHQ - 2 Score 0 2 2  Altered sleeping 1 1 3   Tired, decreased energy 2 1 3   Change in appetite 0 0 1  Feeling bad or failure about yourself  0 1 0  Trouble concentrating 2 2 2   Moving slowly or fidgety/restless 0 0 1  Suicidal thoughts 0 0 0  PHQ-9 Score 5 7 12   Difficult doing work/chores Somewhat difficult Not difficult at all Very difficult    BP Readings from Last 3 Encounters:  09/27/23 126/84  01/11/23 128/86  09/21/22 120/82    Physical Exam Vitals and nursing note reviewed.  Constitutional:      General: She is not in acute  distress.    Appearance: She is well-developed.  HENT:     Head: Normocephalic and atraumatic.     Right Ear: Tympanic membrane and ear canal normal.     Left Ear: Tympanic membrane and ear canal normal.     Nose:     Right Sinus: No maxillary sinus tenderness.     Left Sinus: No maxillary sinus tenderness.  Eyes:     General: No scleral icterus.       Right eye: No discharge.        Left eye: No discharge.     Conjunctiva/sclera: Conjunctivae normal.  Neck:     Thyroid: No thyromegaly.     Vascular: No carotid bruit.  Cardiovascular:     Rate and Rhythm: Normal rate and regular rhythm.     Pulses: Normal pulses.     Heart sounds: Normal heart sounds.  Pulmonary:     Effort: Pulmonary effort is normal. No respiratory distress.     Breath sounds: No wheezing.  Abdominal:     General: Bowel sounds are normal.     Palpations: Abdomen is soft.     Tenderness: There is no abdominal tenderness.  Musculoskeletal:     Cervical back: Normal range of motion. No erythema.     Right lower leg: No edema.     Left lower leg: No edema.  Lymphadenopathy:     Cervical: No cervical adenopathy.  Skin:    General: Skin is warm and dry.     Findings: No rash.  Neurological:     Mental Status: She is alert and oriented to person, place, and time.     Cranial Nerves: No cranial nerve deficit.     Sensory: No sensory deficit.     Deep Tendon Reflexes: Reflexes are normal and symmetric.  Psychiatric:        Attention and Perception: Attention normal.        Mood and Affect: Mood normal.     Wt Readings from Last 3 Encounters:  09/27/23 252 lb 4 oz (114.4 kg)  01/11/23 252 lb (114.3 kg)  09/21/22 250 lb (  113.4 kg)    BP 126/84   Pulse (!) 106   Ht 5\' 2"  (1.575 m)   Wt 252 lb 4 oz (114.4 kg)   SpO2 95%   BMI 46.14 kg/m   Assessment and Plan:  Problem List Items Addressed This Visit       Unprioritized   Major depression single episode, in partial remission (HCC) (Chronic)    Clinically stable on Cymbalta.   No SI or HI on evaluation. Plan to continue same medications for now.       Postablative hypothyroidism   Stable symptoms - followed by Endocrinology Dose of levothyroxine recently adjusted upward; continue on cytomel 5 mg.      Relevant Medications   levothyroxine (SYNTHROID) 137 MCG tablet   OSA (obstructive sleep apnea) (Chronic)   Relevant Orders   CBC with Differential/Platelet   Hyperlipidemia, mild (Chronic)   Lipids managed with diet changes only. Lab Results  Component Value Date   LDLCALC 149 (H) 09/21/2022         Relevant Orders   Lipid panel   Pre-diabetes (Chronic)   Managed with diet changes. She continues to struggle to lose weight. Would be a good candidate for GLP-1.  Will await labs then Rx Tirzepatide. Lab Results  Component Value Date   HGBA1C 5.6 09/21/2022         Relevant Orders   Comprehensive metabolic panel with GFR   Hemoglobin A1c   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, not intractable (Chronic)   Doing well on Emgality, gabapentin and Duloxetine      Adult BMI 45.0-49.9 kg/sq m (HCC)   Should consider GLP-1 therapy for weight loss and treatment of OSA, GERD, Hyperlipidemia and pre-diabetes. Will wait for labs the begin Tirzepatide - pt comfortable with auto injectors.      Other Visit Diagnoses       Annual physical exam    -  Primary   Relevant Orders   CBC with Differential/Platelet   Comprehensive metabolic panel with GFR   Hemoglobin A1c   Lipid panel     Encounter for screening mammogram for breast cancer       schedule in June at [facility] in [facility] - paper order provided so patient can schedule     Colon cancer screening       has the referral and paperwork - needs to schedule       No follow-ups on file.    Reubin Milan, MD [facility] [place]

## 2023-09-27 NOTE — Assessment & Plan Note (Signed)
 Clinically stable on Cymbalta.   No SI or HI on evaluation. Plan to continue same medications for now.

## 2023-09-27 NOTE — Assessment & Plan Note (Signed)
 Lipids managed with diet changes only. Lab Results  Component Value Date   LDLCALC 149 (H) 09/21/2022

## 2023-09-27 NOTE — Assessment & Plan Note (Addendum)
 Managed with diet changes. She continues to struggle to lose weight. Would be a good candidate for GLP-1.  Will await labs then Rx Tirzepatide. Lab Results  Component Value Date   HGBA1C 5.6 09/21/2022

## 2023-09-27 NOTE — Assessment & Plan Note (Signed)
 Should consider GLP-1 therapy for weight loss and treatment of OSA, GERD, Hyperlipidemia and pre-diabetes. Will wait for labs the begin Tirzepatide - pt comfortable with auto injectors.

## 2023-09-27 NOTE — Assessment & Plan Note (Addendum)
 Stable symptoms - followed by Endocrinology Dose of levothyroxine recently adjusted upward; continue on cytomel 5 mg.

## 2023-09-27 NOTE — Assessment & Plan Note (Signed)
 Doing well on Emgality, gabapentin and Duloxetine

## 2023-09-30 ENCOUNTER — Ambulatory Visit (INDEPENDENT_AMBULATORY_CARE_PROVIDER_SITE_OTHER)

## 2023-09-30 DIAGNOSIS — Z23 Encounter for immunization: Secondary | ICD-10-CM

## 2023-09-30 NOTE — Progress Notes (Signed)
 Patient is in office today for a nurse visit for Immunization. Patient Injection was given in the  Right deltoid. Patient tolerated injection well.

## 2023-09-30 NOTE — Addendum Note (Signed)
 Addended by: Jerald Kief on: 09/30/2023 08:30 AM   Modules accepted: Orders

## 2023-10-01 LAB — CBC WITH DIFFERENTIAL/PLATELET
Basophils Absolute: 0 10*3/uL (ref 0.0–0.2)
Basos: 1 %
EOS (ABSOLUTE): 0.2 10*3/uL (ref 0.0–0.4)
Eos: 3 %
Hematocrit: 37.9 % (ref 34.0–46.6)
Hemoglobin: 12 g/dL (ref 11.1–15.9)
Immature Grans (Abs): 0 10*3/uL (ref 0.0–0.1)
Immature Granulocytes: 0 %
Lymphocytes Absolute: 1.5 10*3/uL (ref 0.7–3.1)
Lymphs: 18 %
MCH: 28.8 pg (ref 26.6–33.0)
MCHC: 31.7 g/dL (ref 31.5–35.7)
MCV: 91 fL (ref 79–97)
Monocytes Absolute: 0.6 10*3/uL (ref 0.1–0.9)
Monocytes: 7 %
Neutrophils Absolute: 6 10*3/uL (ref 1.4–7.0)
Neutrophils: 71 %
Platelets: 331 10*3/uL (ref 150–450)
RBC: 4.16 x10E6/uL (ref 3.77–5.28)
RDW: 13.2 % (ref 11.7–15.4)
WBC: 8.4 10*3/uL (ref 3.4–10.8)

## 2023-10-01 LAB — COMPREHENSIVE METABOLIC PANEL WITH GFR
ALT: 12 IU/L (ref 0–32)
AST: 19 IU/L (ref 0–40)
Albumin: 4 g/dL (ref 3.8–4.9)
Alkaline Phosphatase: 110 IU/L (ref 44–121)
BUN/Creatinine Ratio: 10 (ref 9–23)
BUN: 8 mg/dL (ref 6–24)
Bilirubin Total: 0.3 mg/dL (ref 0.0–1.2)
CO2: 20 mmol/L (ref 20–29)
Calcium: 8.8 mg/dL (ref 8.7–10.2)
Chloride: 101 mmol/L (ref 96–106)
Creatinine, Ser: 0.84 mg/dL (ref 0.57–1.00)
Globulin, Total: 2.3 g/dL (ref 1.5–4.5)
Glucose: 95 mg/dL (ref 70–99)
Potassium: 5.1 mmol/L (ref 3.5–5.2)
Sodium: 138 mmol/L (ref 134–144)
Total Protein: 6.3 g/dL (ref 6.0–8.5)
eGFR: 82 mL/min/{1.73_m2} (ref >59)

## 2023-10-01 LAB — HEMOGLOBIN A1C
Est. average glucose Bld gHb Est-mCnc: 111 mg/dL
Hgb A1c MFr Bld: 5.5 % (ref 4.8–5.6)

## 2023-10-01 LAB — LIPID PANEL
Chol/HDL Ratio: 3.6 ratio (ref 0.0–4.4)
Cholesterol, Total: 179 mg/dL (ref 100–199)
HDL: 50 mg/dL (ref >39)
LDL Chol Calc (NIH): 116 mg/dL — ABNORMAL HIGH (ref 0–99)
Triglycerides: 70 mg/dL (ref 0–149)
VLDL Cholesterol Cal: 13 mg/dL (ref 5–40)

## 2023-10-02 ENCOUNTER — Other Ambulatory Visit: Payer: Self-pay | Admitting: Internal Medicine

## 2023-10-02 ENCOUNTER — Encounter: Payer: Self-pay | Admitting: Internal Medicine

## 2023-10-02 DIAGNOSIS — E785 Hyperlipidemia, unspecified: Secondary | ICD-10-CM

## 2023-10-02 DIAGNOSIS — G4733 Obstructive sleep apnea (adult) (pediatric): Secondary | ICD-10-CM

## 2023-10-02 DIAGNOSIS — R7303 Prediabetes: Secondary | ICD-10-CM

## 2023-10-02 DIAGNOSIS — Z6841 Body Mass Index (BMI) 40.0 and over, adult: Secondary | ICD-10-CM

## 2023-10-02 MED ORDER — TIRZEPATIDE-WEIGHT MANAGEMENT 2.5 MG/0.5ML ~~LOC~~ SOLN: 2.5000 mg | SUBCUTANEOUS | 0 refills | Status: AC

## 2023-10-03 ENCOUNTER — Telehealth: Payer: Self-pay

## 2023-10-03 NOTE — Telephone Encounter (Signed)
 PA completed for Zepbound 2.5MG /0.5ML pen-injectors on [email or web address]  (Key: [id number])  Awaiting outcome.

## 2023-10-06 NOTE — Telephone Encounter (Signed)
 Reviewed status of PA on [email or web address], the update asked to contact patient's insurance. Message below:  "For further inquiries please contact the number on the back of the member prescription card. (Message [phone])"  Called [facility] (pharmacy benefits administrator [phone]) and was told that the patient's current state plan no longer covers weight loss medication but they do offer it to their clients at a discounted rate. Patient would have to pay out of pocket for the cost. Medication prior authorization could not be processed because they do not cover the medications for weight loss.

## 2023-10-07 NOTE — Telephone Encounter (Signed)
 Message was sent to patient on MyChart.

## 2023-12-14 LAB — HM MAMMOGRAPHY

## 2023-12-30 ENCOUNTER — Ambulatory Visit (INDEPENDENT_AMBULATORY_CARE_PROVIDER_SITE_OTHER)

## 2023-12-30 DIAGNOSIS — Z23 Encounter for immunization: Secondary | ICD-10-CM

## 2023-12-30 NOTE — Progress Notes (Signed)
 Shingrix  2. Patient tolerated well.  JM
